# Patient Record
Sex: Male | Born: 1950 | Race: White | Hispanic: No | Marital: Single | Smoking: Current every day smoker
Health system: Southern US, Community
[De-identification: ages and names within clinical notes are randomized; demographics above are authoritative.]

## PROBLEM LIST (undated history)

## (undated) DIAGNOSIS — M549 Dorsalgia, unspecified: Secondary | ICD-10-CM

## (undated) DIAGNOSIS — F419 Anxiety disorder, unspecified: Secondary | ICD-10-CM

## (undated) DIAGNOSIS — G8929 Other chronic pain: Secondary | ICD-10-CM

## (undated) DIAGNOSIS — M5136 Other intervertebral disc degeneration, lumbar region: Secondary | ICD-10-CM

## (undated) DIAGNOSIS — L84 Corns and callosities: Secondary | ICD-10-CM

## (undated) DIAGNOSIS — F32A Depression, unspecified: Secondary | ICD-10-CM

## (undated) DIAGNOSIS — J984 Other disorders of lung: Secondary | ICD-10-CM

## (undated) DIAGNOSIS — M51369 Other intervertebral disc degeneration, lumbar region without mention of lumbar back pain or lower extremity pain: Secondary | ICD-10-CM

## (undated) DIAGNOSIS — K5900 Constipation, unspecified: Secondary | ICD-10-CM

## (undated) DIAGNOSIS — I10 Essential (primary) hypertension: Secondary | ICD-10-CM

## (undated) HISTORY — DX: Anxiety disorder, unspecified: F41.9

## (undated) HISTORY — DX: Essential (primary) hypertension: I10

## (undated) HISTORY — DX: Depression, unspecified: F32.A

## (undated) HISTORY — DX: Constipation, unspecified: K59.00

## (undated) HISTORY — DX: Corns and callosities: L84

## (undated) HISTORY — PX: FOOT SURGERY: SHX648

## (undated) HISTORY — DX: Other disorders of lung: J98.4

---

## 2003-12-29 ENCOUNTER — Emergency Department (HOSPITAL_COMMUNITY): Admission: EM | Admit: 2003-12-29 | Discharge: 2003-12-29 | Payer: Self-pay | Admitting: Emergency Medicine

## 2012-08-25 ENCOUNTER — Emergency Department (HOSPITAL_COMMUNITY)
Admission: EM | Admit: 2012-08-25 | Discharge: 2012-08-25 | Disposition: A | Discharge status: Home / Self Care | Payer: BC Managed Care – PPO | Attending: Emergency Medicine | Admitting: Emergency Medicine

## 2012-08-25 DIAGNOSIS — M5380 Other specified dorsopathies, site unspecified: Secondary | ICD-10-CM | POA: Insufficient documentation

## 2012-08-25 DIAGNOSIS — Z8739 Personal history of other diseases of the musculoskeletal system and connective tissue: Secondary | ICD-10-CM | POA: Insufficient documentation

## 2012-08-25 DIAGNOSIS — F17200 Nicotine dependence, unspecified, uncomplicated: Secondary | ICD-10-CM | POA: Insufficient documentation

## 2012-08-25 DIAGNOSIS — M545 Low back pain, unspecified: Secondary | ICD-10-CM | POA: Insufficient documentation

## 2012-08-25 DIAGNOSIS — Z7982 Long term (current) use of aspirin: Secondary | ICD-10-CM | POA: Insufficient documentation

## 2012-08-25 DIAGNOSIS — M62830 Muscle spasm of back: Secondary | ICD-10-CM

## 2012-08-25 MED ORDER — HYDROMORPHONE HCL PF 1 MG/ML IJ SOLN
1.0000 mg | Freq: Once | INTRAMUSCULAR | Status: DC
  Filled 2012-08-25: qty 1

## 2012-08-25 MED ORDER — HYDROMORPHONE HCL PF 1 MG/ML IJ SOLN
1.0000 mg | Freq: Once | INTRAMUSCULAR | Status: AC
  Administered 2012-08-25: 1 mg via INTRAMUSCULAR

## 2012-08-25 MED ORDER — DIAZEPAM 5 MG PO TABS
10.0000 mg | ORAL_TABLET | Freq: Once | ORAL | Status: AC
  Administered 2012-08-25: 10 mg via ORAL
  Filled 2012-08-25: qty 2

## 2012-08-25 NOTE — ED Provider Notes (Signed)
History  This chart was scribed for non-physician practitioner working with Gilda Crease, by Marlin Canary, ED Scribe and Bennett Scrape, ED Scribe. This patient was seen in room WTR6/WTR6 and the patient's care was started at 2022.  CSN: [id number]  Arrival date & time 08/25/12  4098   First MD Initiated Contact with Patient 08/25/12 2022      Chief Complaint  Patient presents with  . Back Pain     Patient is a 62 y.o. male presenting with back pain. The history is provided by the patient. No language interpreter was used.  Back Pain  The current episode started 1 to 2 hours ago. The problem occurs constantly. The problem has not changed since onset.The pain is present in the lumbar spine. The pain is severe. The symptoms are aggravated by bending. Pertinent negatives include no chest pain, no fever, no headaches, no abdominal pain and no dysuria. He has tried muscle relaxants for the symptoms.    Jerry Spears is a 62 y.o. male who presents to the Emergency Department complaining of lower back pain that began while bending over after washing his car onset 1-2 hours ago. Symptoms have been constant but waxing and waning since onset and he rates them as a severe. The patient reports the symptoms are located in the lumbar area and describes the pain as a cramping pain that radiates down his leg. Per patient, he denies urinary symptoms, urinary or bowel incontinence and saddle anesthesia as associated symptoms. The patient's symptoms are improved with sitting and exacerbated by movement. He reports a history of similar symptoms and states that he takes 3, 10 mg Percocet pills and 3, 10 mg Flexeril pills daily prescribed by his PCP for disk compression. Pt reports he took 6, 10 mg Percocet throughout the day and 2, 10 mg Flexeril around 1700 with no improvement in symptoms. He states he has had these symptoms at this severity 8 years ago. He denies following up with an orthopedist  for these symptoms before. He denies any other chronic medical conditions.  PCP Dr. Tiburcio Pea.    No past medical history on file.  No past surgical history on file.  No family history on file.  History  Substance Use Topics  . Smoking status: Not on file  . Smokeless tobacco: Not on file  . Alcohol Use: Not on file      Review of Systems  Constitutional: Negative for fever, diaphoresis, appetite change, fatigue and unexpected weight change.  HENT: Negative for mouth sores and neck stiffness.   Eyes: Negative for visual disturbance.  Respiratory: Negative for cough, chest tightness, shortness of breath and wheezing.   Cardiovascular: Negative for chest pain.  Gastrointestinal: Negative for nausea, vomiting, abdominal pain, diarrhea and constipation.  Genitourinary: Negative for dysuria, urgency, frequency and hematuria.  Musculoskeletal: Positive for back pain.  Skin: Negative for rash.  Neurological: Negative for syncope, light-headedness and headaches.  Hematological: Does not bruise/bleed easily.  Psychiatric/Behavioral: Negative for sleep disturbance. The patient is not nervous/anxious.   All other systems reviewed and are negative.    Allergies  Review of patient's allergies indicates no known allergies.  Home Medications   Current Outpatient Rx  Name  Route  Sig  Dispense  Refill  . ASPIRIN 325 MG PO TABS   Oral   Take 325 mg by mouth every 6 (six) hours as needed. For pain.         . CYCLOBENZAPRINE HCL 10 MG PO TABS  Oral   Take 10 mg by mouth 3 (three) times daily as needed. For muscle spasm.         . OXYCODONE-ACETAMINOPHEN 10-325 MG PO TABS   Oral   Take 1 tablet by mouth every 4 (four) hours as needed. For pain.           Triage Vitals: BP 173/90  Pulse 73  Temp 98.2 F (36.8 C) (Oral)  Resp 16  SpO2 99%  Physical Exam  Nursing note and vitals reviewed. Constitutional: He is oriented to person, place, and time. He appears  well-developed and well-nourished. No distress.  HENT:  Head: Normocephalic and atraumatic.  Mouth/Throat: Oropharynx is clear and moist. No oropharyngeal exudate.  Eyes: Conjunctivae normal are normal. No scleral icterus.  Neck: Normal range of motion. Neck supple.  Cardiovascular: Normal rate, regular rhythm, normal heart sounds and intact distal pulses.  Exam reveals no gallop and no friction rub.   No murmur heard.      Regular rate and rhythm  Pulmonary/Chest: Effort normal and breath sounds normal. No respiratory distress. He has no wheezes.  Abdominal: Soft. Bowel sounds are normal. He exhibits no mass. There is no tenderness. There is no rebound and no guarding.  Musculoskeletal: Normal range of motion. He exhibits no edema and no tenderness.       Lumbar back: He exhibits tenderness and pain. He exhibits no bony tenderness, no swelling, no edema, no deformity, no laceration and no spasm.       Decreased ROM when bending secondary to pain Full ROM in all other joints no paraspinous tenderness or spinious process tenderness in C, T, or L spine  Patient with difficulty standing secondary to pain.  Patient with obvious grimace as he has muscle spasm with movement.  Neurological: He is alert and oriented to person, place, and time. He has normal strength and normal reflexes. No cranial nerve deficit or sensory deficit. He exhibits normal muscle tone. GCS eye subscore is 4. GCS verbal subscore is 5. GCS motor subscore is 6.  Reflex Scores:      Patellar reflexes are 2+ on the right side and 2+ on the left side.      Achilles reflexes are 2+ on the right side and 2+ on the left side.      Speech is clear and goal oriented Moves extremities without ataxia 5/5 strength, reflexes intact and sensation intact in lower extremities, Intact distal pulses  Skin: Skin is warm and dry. No rash noted. He is not diaphoretic.          Psychiatric: He has a normal mood and affect.    ED Course    Procedures (including critical care time)  DIAGNOSTIC STUDIES: Oxygen Saturation is 99% on room air, Normal by my interpretation.    COORDINATION OF CARE: 2045-Patient informed of clinical course, understands medical decision-making process, and agrees with plan. Will order dilaudid and valium. Will also provide a referral to orthopedist. Pt's wife states that she can drive the pt home.  Labs Reviewed - No data to display No results found.   1. Back muscle spasm   2. Low back pain       MDM  Jerry Spears presents with persistent muscle spasm of his l-spine.  Patient without trauma, fall or known injury.   I do not believe that imaging is warranted at this time. Patient with back pain.  No neurological deficits and normal neuro exam.  He given 1 mg of  Dilaudid IM and thallium 10 mg by mouth. Patient states he feels much better after medication.  Patient is ambulatory without assistance after medication.  Patient can walk but states is still mildly painful.  No loss of bowel or bladder control.  No concern for cauda equina.  No fever, night sweats, weight loss, h/o cancer, IVDU.  RICE protocol and pain medicine indicated and discussed with patient. Will have patient followup with orthopedics or primary care physician.  1. Medications: usual home medications including Percocet and Flexeril as needed for back pain and spasm 2. Treatment: rest, drink plenty of fluids, take medications as prescribed 3. Follow Up: Please followup with your primary doctor for discussion of your diagnoses and further evaluation after today's visit; if you do not have a primary care doctor use the resource guide provided to find one; with [facility] - Dr. Yevette Edwards    I personally performed the services described in this documentation, which was scribed in my presence. The recorded information has been reviewed and is accurate.   Dahlia Client Kaitlynd Phillips, PA-C 08/25/12 2200

## 2012-08-25 NOTE — ED Notes (Signed)
Pt c/o low back pain and spasm. Pt states he has had back pain since this afternoon. States he was bending over and his back cramped up. Pt took Percocet and Flexeril PTA. Pt states he took about 6 Percocet throughout the day and 2 Flexeril. Pt last had Flexeril at 1700. Pt ambulatory to exam room with steady gait. Pt BIB spouse.

## 2012-08-26 ENCOUNTER — Emergency Department (HOSPITAL_COMMUNITY): Payer: BC Managed Care – PPO

## 2012-08-26 ENCOUNTER — Emergency Department (HOSPITAL_COMMUNITY)
Admission: EM | Admit: 2012-08-26 | Discharge: 2012-08-26 | Disposition: A | Discharge status: Home / Self Care | Payer: BC Managed Care – PPO | Attending: Emergency Medicine | Admitting: Emergency Medicine

## 2012-08-26 ENCOUNTER — Encounter (HOSPITAL_COMMUNITY): Payer: Self-pay | Admitting: *Deleted

## 2012-08-26 DIAGNOSIS — M51379 Other intervertebral disc degeneration, lumbosacral region without mention of lumbar back pain or lower extremity pain: Secondary | ICD-10-CM | POA: Insufficient documentation

## 2012-08-26 DIAGNOSIS — M546 Pain in thoracic spine: Secondary | ICD-10-CM | POA: Insufficient documentation

## 2012-08-26 DIAGNOSIS — M5137 Other intervertebral disc degeneration, lumbosacral region: Secondary | ICD-10-CM | POA: Insufficient documentation

## 2012-08-26 DIAGNOSIS — J42 Unspecified chronic bronchitis: Secondary | ICD-10-CM

## 2012-08-26 DIAGNOSIS — Z7982 Long term (current) use of aspirin: Secondary | ICD-10-CM | POA: Insufficient documentation

## 2012-08-26 DIAGNOSIS — F17200 Nicotine dependence, unspecified, uncomplicated: Secondary | ICD-10-CM | POA: Insufficient documentation

## 2012-08-26 DIAGNOSIS — M5126 Other intervertebral disc displacement, lumbar region: Secondary | ICD-10-CM

## 2012-08-26 HISTORY — DX: Other intervertebral disc degeneration, lumbar region: M51.36

## 2012-08-26 HISTORY — DX: Other intervertebral disc degeneration, lumbar region without mention of lumbar back pain or lower extremity pain: M51.369

## 2012-08-26 MED ORDER — IBUPROFEN 800 MG PO TABS
800.0000 mg | ORAL_TABLET | Freq: Once | ORAL | Status: AC
  Administered 2012-08-26: 800 mg via ORAL
  Filled 2012-08-26: qty 1

## 2012-08-26 MED ORDER — HYDROMORPHONE HCL 2 MG PO TABS: 2.0000 mg | ORAL_TABLET | Freq: Four times a day (QID) | ORAL | Status: DC | PRN

## 2012-08-26 MED ORDER — HYDROMORPHONE HCL PF 1 MG/ML IJ SOLN
1.0000 mg | Freq: Once | INTRAMUSCULAR | Status: AC
  Administered 2012-08-26: 1 mg via INTRAMUSCULAR
  Filled 2012-08-26: qty 1

## 2012-08-26 MED ORDER — DIAZEPAM 5 MG PO TABS: 5.0000 mg | ORAL_TABLET | Freq: Two times a day (BID) | ORAL | Status: DC

## 2012-08-26 MED ORDER — PREDNISONE 20 MG PO TABS
40.0000 mg | ORAL_TABLET | Freq: Once | ORAL | Status: AC
  Administered 2012-08-26: 40 mg via ORAL
  Filled 2012-08-26: qty 2

## 2012-08-26 MED ORDER — ALBUTEROL SULFATE HFA 108 (90 BASE) MCG/ACT IN AERS
2.0000 | INHALATION_SPRAY | Freq: Once | RESPIRATORY_TRACT | Status: AC
  Administered 2012-08-26: 2 via RESPIRATORY_TRACT
  Filled 2012-08-26: qty 6.7

## 2012-08-26 MED ORDER — HYDROMORPHONE HCL PF 1 MG/ML IJ SOLN
1.0000 mg | INTRAMUSCULAR | Status: DC | PRN
  Administered 2012-08-26: 1 mg via INTRAVENOUS
  Filled 2012-08-26: qty 1

## 2012-08-26 NOTE — ED Notes (Signed)
An attempt was made to make a secondary report but the patient was asleep and unable to talk coherently.  The patient was awaken and asked what his pain level was and he responded "32."  Pt's family states he has been awake for 24 hours.

## 2012-08-26 NOTE — ED Provider Notes (Signed)
History     CSN: [id number]  Arrival date & time 08/26/12  0440   First MD Initiated Contact with Patient 08/26/12 [phone]      Chief Complaint  Patient presents with  . Back Pain    (Consider location/radiation/quality/duration/timing/severity/associated sxs/prior treatment) HPI Comments: Jerry Spears reports severe back pain.  He has had 2 flexeril and 2 percocet at home since leaving the ER at 0040 without symptomatic relief.  He reports being unable to sleep because anytime he moves or rolls, the pain becomes unbearable.  Patient is a 62 y.o. male presenting with back pain. The history is provided by the patient. No language interpreter was used.  Back Pain  This is a recurrent problem. The current episode started yesterday. The problem occurs constantly. The problem has not changed since onset.Associated with: pain began after washing the car.  He stood from rolling the hose and immediately experienced severe, spastic pain. The pain is present in the lumbar spine. The quality of the pain is described as cramping and burning. The pain does not radiate. The pain is at a severity of 10/10. The pain is severe. The symptoms are aggravated by certain positions and bending. Pertinent negatives include no chest pain, no fever, no numbness, no abdominal pain, no abdominal swelling, no bowel incontinence, no perianal numbness, no bladder incontinence, no dysuria, no pelvic pain, no leg pain, no paresthesias, no paresis and no weakness. He has tried analgesics, muscle relaxants and bed rest for the symptoms. The treatment provided no relief.    Past Medical History  Diagnosis Date  . DDD (degenerative disc disease), lumbar     No past surgical history on file.  No family history on file.  History  Substance Use Topics  . Smoking status: Current Every Day Smoker    Types: Cigarettes  . Smokeless tobacco: Not on file  . Alcohol Use: Not on file      Review of Systems  Constitutional:  Negative for fever.  Cardiovascular: Negative for chest pain.  Gastrointestinal: Negative for abdominal pain and bowel incontinence.  Genitourinary: Negative for bladder incontinence, dysuria and pelvic pain.  Musculoskeletal: Positive for back pain.  Neurological: Negative for weakness, numbness and paresthesias.  All other systems reviewed and are negative.    Allergies  Review of patient's allergies indicates no known allergies.  Home Medications   Current Outpatient Rx  Name  Route  Sig  Dispense  Refill  . ASPIRIN 325 MG PO TABS   Oral   Take 325 mg by mouth every 6 (six) hours as needed. For pain.         . CYCLOBENZAPRINE HCL 10 MG PO TABS   Oral   Take 10 mg by mouth 3 (three) times daily as needed. For muscle spasm.         . OXYCODONE-ACETAMINOPHEN 10-325 MG PO TABS   Oral   Take 1 tablet by mouth every 4 (four) hours as needed. For pain.           BP 147/84  Pulse 71  Temp 98.8 F (37.1 C) (Oral)  Resp 20  SpO2 95%  Physical Exam  Nursing note and vitals reviewed. Constitutional: He is oriented to person, place, and time. He appears well-developed and well-nourished. No distress.  HENT:  Head: Normocephalic and atraumatic.  Right Ear: External ear normal.  Left Ear: External ear normal.  Nose: Nose normal.  Mouth/Throat: Oropharynx is clear and moist. No oropharyngeal exudate.  Eyes: Conjunctivae normal  are normal. Pupils are equal, round, and reactive to light. Right eye exhibits no discharge. Left eye exhibits no discharge. No scleral icterus.  Neck: Normal range of motion. Neck supple. No JVD present. No tracheal deviation present.  Cardiovascular: Normal rate, regular rhythm, normal heart sounds and intact distal pulses.  Exam reveals no gallop and no friction rub.   No murmur heard. Pulmonary/Chest: Effort normal. No stridor. No respiratory distress. He has wheezes. He has no rales. He exhibits no tenderness.  Abdominal: Soft. Bowel sounds are  normal. He exhibits no distension and no mass. There is no tenderness. There is no rebound and no guarding.  Musculoskeletal: He exhibits tenderness. He exhibits no edema.       Cervical back: Normal.       Thoracic back: He exhibits tenderness and pain. He exhibits no bony tenderness and no spasm.       Lumbar back: He exhibits decreased range of motion, tenderness, pain and spasm. He exhibits no bony tenderness, no swelling, no edema, no deformity and no laceration.  Lymphadenopathy:    He has no cervical adenopathy.  Neurological: He is alert and oriented to person, place, and time.  Skin: Skin is warm and dry. No rash noted. He is not diaphoretic. No erythema. No pallor.  Psychiatric: He has a normal mood and affect. His behavior is normal.    ED Course  Procedures (including critical care time)  Labs Reviewed - No data to display No results found.   No diagnosis found.    MDM  Pt presents for evaluation of back pain.  He was seen here and discharged at 0040 for similar pain which has not resolved.  He appear somnolent, easily aroused, nontoxic, note stable VS.  He has no focal neurologic deficit but is suffering from spastic lower back pain.  Will administer ibuprofen, prednisone, and dilaudid.  Will not administer any other sedatives as he already is having difficulty staying awake.  He is comfortable as long as he does not move.  0725.  Pt signed over to Dr. Lynelle Doctor.  He continues to have significant pain whenever he moves.  Plan continue to titrate pain medication and anti-spasmodics as tolerated.  CXR and lumbar xrays are pending.      Tobin Chad, MD 08/26/12 [id number]

## 2012-08-26 NOTE — ED Provider Notes (Signed)
The patient was seen by Dr. Lorenso Courier earlier. He ordered medications for pain and ordered x-rays as well as an MRI of the lumbar spine  Physical Exam  BP 147/84  Pulse 71  Temp 98.8 F (37.1 C) (Oral)  Resp 20  SpO2 95%  Physical Exam Alert, able to sit up with some discomfort ED Course  Procedures Dg Orbits  08/26/2012  *RADIOLOGY REPORT*  Clinical Data: Pre MRI. History of previous metal working.  ORBITS - COMPLETE 4+ VIEW  Comparison: None.  Findings: There is no radiodense foreign body in or around the orbits.  Osseous structures are normal.  IMPRESSION: Normal exam.   Original Report Authenticated By: Francene Boyers, M.D.    Dg Chest 2 View  08/26/2012  *RADIOLOGY REPORT*  Clinical Data: Cough and congestion.  New onset severe back pain.  CHEST - 2 VIEW  Comparison: None.  Findings: Trachea is midline.  Heart size normal.  Mild biapical pleural parenchymal scarring.  Lungs are otherwise clear.  No pleural fluid.  IMPRESSION: No acute findings.   Original Report Authenticated By: Leanna Battles, M.D.    Dg Lumbar Spine Complete  08/26/2012  *RADIOLOGY REPORT*  Clinical Data: New onset back pain.  LUMBAR SPINE - COMPLETE 4+ VIEW  Comparison: None.  Findings: Partial sacralization of L5.  Alignment is anatomic. Vertebral body height is maintained.  No definite pars defects. Endplate degenerative changes are worst at L3-4 and L5-S1, with associated loss of disc space height at L5-S1, and facet hypertrophy.  A fair amount of stool is in the visualized colon.  IMPRESSION:  1.  Spondylosis, worst at L3-4 and L5-S1. 2.  Constipation.   Original Report Authenticated By: Leanna Battles, M.D.    Dg Abd 1 View  08/26/2012  *RADIOLOGY REPORT*  Clinical Data: Pre MRI.  History of ingesting nail.  ABDOMEN - 1 VIEW  Comparison: 08/26/2012 the lumbar spine plain film examination.  Findings: No evidence of radiopaque foreign object throughout the bowel.  Moderate stool right colon.  IMPRESSION: No radiopaque  foreign body.   Original Report Authenticated By: Lacy Duverney, M.D.    Mr Lumbar Spine Wo Contrast  08/26/2012  *RADIOLOGY REPORT*  Clinical Data: Low back pain and spasm.  Degenerative disc disease.  MRI LUMBAR SPINE WITHOUT CONTRAST  Technique:  Multiplanar and multiecho pulse sequences of the lumbar spine were obtained without intravenous contrast.  Comparison: Multiple exams, including 08/26/2012  Findings: Comparison with prior chest radiographs and abdominal and lumbar radiographs reveals that there are 13 paired ribs, four fully segmental non-rib bearing vertebra, and also a transitional lumbosacral vertebra.  Accordingly, I will assume that there are small ribs at the L1 level, and that the transitional vertebra is a transitional S1.  This is congruent with the numbering on the prior lumbar spine radiographs.  If procedural intervention is to be performed, careful correlation with this numbering strategy would be recommended.  Based on this numbering strategy, the conus medullaris is at the L2- 3 level.    No mass or specific secondary findings of tethering.  Disc desiccation noted at all levels between L3 and S1, with loss of disc height at L5-S1.  Inversion recovery weighted images demonstrate no significant abnormal vertebral or periligamentous edema.  The patient refused the axial T1-weighted images due to back pain, despite being premedicated.  Additional findings at individual levels are as follows:  L2-3:  No impingement.  Bilateral facet arthropathy.  L3-4:  Mild left subarticular lateral recess stenosis due to  disc bulge.  Mild facet arthropathy.  L4-5:  Borderline left subarticular lateral recess stenosis secondary to disc bulge. Right greater than left facet arthropathy.  L5-S1:  Moderate right and mild left subarticular lateral recess stenosis observed with mild left and borderline right foraminal stenosis and borderline central stenosis secondary to disc bulge, left foraminal disc  protrusion, intervertebral and facet spurring, and mild ligament flavum redundancy.  S1-2:  No impingement.  Broad left transverse process of S1 articulates with the remainder of the sacrum.  Small central hemangioma is seen in the S1 vertebral body.  IMPRESSION:  1.  Lumbar spondylosis and degenerative disc disease cause moderate impingement at L5-S1 and mild impingement L3-4 as noted above. 2.  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 This is particularly important if procedural intervention is to be employed.  The low positioning of the conus, at L2-3, is believed to be due to this numbering situation given that no specific findings of tethering are observed.   Original Report Authenticated By: Gaylyn Rong, M.D.     MDM Patient's MRI shows it is a herniated disc causing impingement. This is the etiology of his back pain. He has no acute neurologic dysfunction that would require emergent surgery.  Patient referred to neurosurgery for further outpatient followup.  I will add on oral Dilaudid tablets instead of his Percocet. He understands to not take both simultaneously. Otherwise discontinue the Flexeril and have him try Valium.      Celene Kras, MD 08/26/12 1054

## 2012-08-26 NOTE — ED Notes (Signed)
Pt c/o acute exacerbation of chronic back pain. Pt seen and treated yesterday, gained some relief at that time.

## 2012-08-26 NOTE — ED Provider Notes (Signed)
Medical screening examination/treatment/procedure(s) were performed by non-physician practitioner and as supervising physician I was immediately available for consultation/collaboration.   Gilda Crease, MD 08/26/12 0040

## 2013-06-23 ENCOUNTER — Ambulatory Visit: Payer: Self-pay | Admitting: Family Medicine

## 2013-06-23 VITALS — BP 132/80 | HR 78 | Temp 97.9°F | Resp 16 | Ht 75.0 in | Wt 230.0 lb

## 2013-06-23 DIAGNOSIS — Z0289 Encounter for other administrative examinations: Secondary | ICD-10-CM

## 2013-06-23 DIAGNOSIS — Z0000 Encounter for general adult medical examination without abnormal findings: Secondary | ICD-10-CM

## 2013-06-23 NOTE — Progress Notes (Signed)
UA: Sp Gr: 1.030 Protein: neg Blood: neg Sugar: neg

## 2013-06-23 NOTE — Progress Notes (Signed)
Patient ID: Jerry Spears., male   DOB: 23-Nov-1950, 62 y.o.   MRN: [id number]  @[email or web address] @  Patient ID: Jerry Spears. MRN: [id number], DOB: [birth year], 62 y.o. Date of Encounter: 06/23/2013, 9:55 AM This chart was scribed for Elvina Sidle, MD by Valera Castle, ED Scribe. This patient was seen in room 4 and the patient's care was started at 9:55 AM.  Primary Physician: Provider Not In System  Chief Complaint: DOT  HPI: 62 y.o. year old male with history below presents for a DOT exam. He reports h/o spurs on his lower spine, being seen by doctor, and being told he was going to have to live with the pain. He reports taking Percocet for the pain daily, with relief. He reports needing to make an appointment to get new glasses. He states being able to see enough for work, but that his eyes are slowly getting worse. He reports a h/o smoking. He denies any other complaints and medical history.   He reports living in [place] and having a granddaughter in middle school.    Past Medical History  Diagnosis Date  . DDD (degenerative disc disease), lumbar      Home Meds: Prior to Admission medications   Medication Sig Start Date End Date Taking? Authorizing Provider  oxyCODONE-acetaminophen (PERCOCET) 10-325 MG per tablet Take 1 tablet by mouth every 4 (four) hours as needed. For pain.   Yes Historical Provider, MD  aspirin 325 MG tablet Take 325 mg by mouth every 6 (six) hours as needed. For pain.    Historical Provider, MD  diazepam (VALIUM) 5 MG tablet Take 1 tablet (5 mg total) by mouth 2 (two) times daily. 08/26/12   Celene Kras, MD  HYDROmorphone (DILAUDID) 2 MG tablet Take 1 tablet (2 mg total) by mouth every 6 (six) hours as needed for pain. 08/26/12   Celene Kras, MD   Allergies: No Known Allergies  History   Social History  . Marital Status: Married    Spouse Name: N/A    Number of Children: N/A  . Years of Education: N/A   Occupational History  . Not on file.    Social History Main Topics  . Smoking status: Current Every Day Smoker    Types: Cigarettes  . Smokeless tobacco: Not on file  . Alcohol Use: Not on file  . Drug Use: No  . Sexual Activity: Yes    Birth Control/ Protection: None   Other Topics Concern  . Not on file   Social History Narrative  . No narrative on file     Review of Systems: Constitutional: negative for chills, fever, night sweats, weight changes, or fatigue  HEENT: negative for vision changes, hearing loss, congestion, rhinorrhea, ST, epistaxis, or sinus pressure Cardiovascular: negative for chest pain or palpitations Respiratory: negative for hemoptysis, wheezing, shortness of breath, or cough Abdominal: negative for abdominal pain, nausea, vomiting, diarrhea, or constipation Dermatological: negative for rash Musc/Skeletal: Positive for lower back pain Neurologic: negative for headache, dizziness, or syncope All other systems reviewed and are otherwise negative with the exception to those above and in the HPI.   Physical Exam: Blood pressure 132/80, pulse 78, temperature 97.9 F (36.6 C), temperature source Oral, resp. rate 16, height 6\' 3"  (1.905 m), weight 230 lb (104.327 kg), SpO2 98.00%., Body mass index is 28.75 kg/(m^2). General: Well developed, well nourished, in no acute distress. Head: Normocephalic, atraumatic, eyes without discharge, sclera non-icteric, nares are without discharge. Bilateral auditory canals clear,  TM's are without perforation, pearly grey and translucent with reflective cone of light bilaterally. Oral cavity moist, posterior pharynx without exudate, erythema, peritonsillar abscess, or post nasal drip.  Neck: Supple. No thyromegaly. Full ROM. No lymphadenopathy. Lungs: Clear bilaterally to auscultation without wheezes, rales, or rhonchi. Breathing is unlabored. Heart: RRR with S1 S2. No murmurs, rubs, or gallops appreciated. Abdomen: Soft, non-tender, non-distended with normoactive  bowel sounds. No hepatomegaly. No rebound/guarding. No obvious abdominal masses. Msk:  Strength and tone normal for age. Extremities/Skin: Warm and dry. No clubbing or cyanosis. No edema. No rashes or suspicious lesions. Neuro: Alert and oriented X 3. Moves all extremities spontaneously. Gait is normal. CNII-XII grossly in tact. Psych:  Responds to questions appropriately with a normal affect.   ASSESSMENT AND PLAN:  62 y.o. year old male with DOT PE normal   Signed, Elvina Sidle, MD 06/23/2013 9:55 AM     I personally performed the services described in this documentation, which was scribed in my presence. The recorded information has been reviewed and is accurate.

## 2014-08-03 ENCOUNTER — Encounter (HOSPITAL_COMMUNITY): Payer: Self-pay | Admitting: *Deleted

## 2014-08-03 ENCOUNTER — Emergency Department (HOSPITAL_COMMUNITY)
Admission: EM | Admit: 2014-08-03 | Discharge: 2014-08-03 | Disposition: A | Discharge status: Home / Self Care | Payer: BC Managed Care – PPO | Attending: Emergency Medicine | Admitting: Emergency Medicine

## 2014-08-03 DIAGNOSIS — G8929 Other chronic pain: Secondary | ICD-10-CM | POA: Insufficient documentation

## 2014-08-03 DIAGNOSIS — Z79899 Other long term (current) drug therapy: Secondary | ICD-10-CM | POA: Insufficient documentation

## 2014-08-03 DIAGNOSIS — Z7982 Long term (current) use of aspirin: Secondary | ICD-10-CM | POA: Insufficient documentation

## 2014-08-03 DIAGNOSIS — Z720 Tobacco use: Secondary | ICD-10-CM | POA: Insufficient documentation

## 2014-08-03 DIAGNOSIS — F1120 Opioid dependence, uncomplicated: Secondary | ICD-10-CM

## 2014-08-03 DIAGNOSIS — Z8739 Personal history of other diseases of the musculoskeletal system and connective tissue: Secondary | ICD-10-CM | POA: Insufficient documentation

## 2014-08-03 NOTE — Discharge Instructions (Signed)
Finding Treatment for Alcohol and Drug Addiction °It can be hard to find the right place to get professional treatment. Here are some important things to consider: °· There are different types of treatment to choose from. °· Some programs are live-in (residential) while others are not (outpatient). Sometimes a combination is offered. °· No single type of program is right for everyone. °· Most treatment programs involve a combination of education, counseling, and a 12-step, spiritually-based approach. °· There are non-spiritually based programs (not 12-step). °· Some treatment programs are government sponsored. They are geared for patients without private insurance. °· Treatment programs can vary in many respects such as: °¨ Cost and types of insurance accepted. °¨ Types of on-site medical services offered. °¨ Length of stay, setting, and size. °¨ Overall philosophy of treatment.  °A person may need specialized treatment or have needs not addressed by all programs. For example, adolescents need treatment appropriate for their age. Other people have secondary disorders that must be managed as well. Secondary conditions can include mental illness, such as depression or diabetes. Often, a period of detoxification from alcohol or drugs is needed. This requires medical supervision and not all programs offer this. °THINGS TO CONSIDER WHEN SELECTING A TREATMENT PROGRAM  °· Is the program certified by the appropriate government agency? Even private programs must be certified and employ certified professionals. °· Does the program accept your insurance? If not, can a payment plan be set up? °· Is the facility clean, organized, and well run? Do they allow you to speak with graduates who can share their treatment experience with you? Can you tour the facility? Can you meet with staff? °· Does the program meet the full range of individual needs? °· Does the treatment program address sexual orientation and physical disabilities?  Do they provide age, gender, and culturally appropriate treatment services? °· Is treatment available in languages other than English? °· Is long-term aftercare support or guidance encouraged and provided? °· Is assessment of an individual's treatment plan ongoing to ensure it meets changing needs? °· Does the program use strategies to encourage reluctant patients to remain in treatment long enough to increase the likelihood of success? °· Does the program offer counseling (individual or group) and other behavioral therapies? °· Does the program offer medicine as part of the treatment regimen, if needed? °· Is there ongoing monitoring of possible relapse? Is there a defined relapse prevention program? Are services or referrals offered to family members to ensure they understand addiction and the recovery process? This would help them support the recovering individual. °· Are 12-step meetings held at the center or is transport available for patients to attend outside meetings? °In countries outside of the [place], see local directories for contact information for services in your area. °Document Released: 06/22/2005 Document Revised: 10/16/2011 Document Reviewed: 01/02/2008 °ExitCare® Patient Information ©2015 [employer]. This information is not intended to replace advice given to you by your health care provider. Make sure you discuss any questions you have with your health care provider. ° °Emergency Department Resource Guide °1) Find a Doctor and Pay Out of Pocket °Although you won't have to find out who is covered by your insurance plan, it is a good idea to ask around and get recommendations. You will then need to call the office and see if the doctor you have chosen will accept you as a new patient and what types of options they offer for patients who are self-pay. Some doctors offer discounts or will   set up payment plans for their patients who do not have insurance, but you will need to ask so you  aren't surprised when you get to your appointment. ° °2) Contact Your Local Health Department °Not all health departments have doctors that can see patients for sick visits, but many do, so it is worth a call to see if yours does. If you don't know where your local health department is, you can check in your phone book. The CDC also has a tool to help you locate your state's health department, and many state websites also have listings of all of their local health departments. ° °3) Find a Walk-in Clinic °If your illness is not likely to be very severe or complicated, you may want to try a walk in clinic. These are popping up all over the country in pharmacies, drugstores, and shopping centers. They're usually staffed by nurse practitioners or physician assistants that have been trained to treat common illnesses and complaints. They're usually fairly quick and inexpensive. However, if you have serious medical issues or chronic medical problems, these are probably not your best option. ° °No Primary Care Doctor: °- Call [facility] at  832-8000 - they can help you locate a primary care doctor that  accepts your insurance, provides certain services, etc. °- Physician Referral Service- 1-800-533-3463 ° °Chronic Pain Problems: °Organization         Address  Phone   Notes  °[facility]  (336) 297-2271 Patients need to be referred by their primary care doctor.  ° °Medication Assistance: °Organization         Address  Phone   Notes  °[place] [facility] [address] °[place], [place] [address] (336) 641-8030 --Must be a resident of [place] °-- Must have NO insurance coverage whatsoever (no Medicaid/ Medicare, etc.) °-- The pt. MUST have a primary care doctor that directs their care regularly and follows them in the community °  °[facility]  (866) 331-1348   °[employer]  (888) 892-1162   ° °Agencies that provide inexpensive medical care: °Organization          Address  Phone   Notes  °[facility]  (336) 832-8035   °[facility]    (336) 832-7272   °[facility] [address] °[place], [place] [address] (336) 832-4777   °[facility] [address], °[place] (336) 271-4999   °[facility]    (336) 373-0678   °[facility]    (336) 272-1050   °[facility] ° [address], [place] Phone:  (336) 832-4444, Fax:  (336) 832-4440 Hours of Operation:  9 am - 6 pm, M-F.  Also accepts Medicaid/Medicare and self-pay.  °[facility] ° [address], [place] Phone: (336) 832-3150, Fax: (336) 832-3151. Hours of Operation:  8:30 am - 5:30 pm, M-F.  Also accepts Medicaid and self-pay.  °[facility] [place] [address], [place] Phone: (336) 878-6027   °[facility] [address], [place], [place] (336)723-1848, Ext. 123 Mondays & Thursdays: 7-9 AM.  First 15 patients are seen on a first come, first serve basis. °  ° °Medicaid-accepting [place] Providers: ° °Organization         Address  Phone   Notes  °[facility] [address], [place] (336) 641-2100   Also accepts self-pay patients.  °[facility] [address], [place] ° (336) 856-9996   °[facility] [address], [place] (336) 288-8857   °[facility] [address] [place] [address], [place] (336) 299-7000   °Veita Bland [address], [place]  ° (336) 373-1557 Only accepts [place] Access Medicaid patients after they have their name applied to their card.  ° °Self-Pay (no insurance) in [place]: ° °Organization         Address  Phone   Notes  °Sickle Cell Patients, [facility] [address], [place] (336) 832-1970   °[facility] [address], [place] (336) 832-4400    °[facility] ° [address], [place] (336) 992-4800   °[facility]/Dr. Osei-Bonsu ° [address] [place] [address], [place] or [address], [place] (336) 841-8500 Phone number for both [place] and [place] locations is the same.  °[facility] [address], [place] (336) 299-0000   °[facility] [address] [place] [address], [place] or [address] (336) 852-7530 °(336) 878-2260   °[facility] [address], [place] (336) 350-1642, phone; (336) 294-5005, fax Sees patients 1st and 3rd Saturday of every month.  Must not qualify for public or private insurance (i.e. Medicaid, Medicare, [place] Health Choice, Veterans' Benefits) • Household income should be no more than 200% of the poverty level •The clinic cannot treat you if you are pregnant or think you are pregnant • Sexually transmitted diseases are not treated at the clinic.  ° ° °Dental Care: °Organization         Address  Phone  Notes  °[facility] [address], [place] (336) 641-6152 Accepts children up to age 21 who are enrolled in Medicaid or [place] Health Choice; pregnant women with a Medicaid card; and children who have applied for Medicaid or [place] Health Choice, but were declined, whose parents can pay a reduced fee at time of service.  °[facility] [place]  [address], [place] (336) 641-7733 Accepts children up to age 21 who are enrolled in Medicaid or [place] Health Choice; pregnant women with a Medicaid card; and children who have applied for Medicaid or [place] Health Choice, but were declined, whose parents can pay a reduced fee at time of service.  °[facility] ° [address], [place] (336) 641-4533 Patients are seen by appointment only. Walk-ins are not accepted. [facility] will see patients 18  years of age and older. °Monday - Tuesday (8am-5pm) °Most Wednesdays (8:30-5pm) °$30 per visit, cash only  °[facility] ° [address], [place] (336) 641-4533 Patients are seen by appointment only. Walk-ins are not accepted. [facility] will see patients 18 years of age and older. °One Wednesday Evening (Monthly: Volunteer Based).  $30 per visit, cash only  °[facility]  (919) 537-3737 for adults; Children under age 4, call Graduate Pediatric Dentistry at (919) [phone]. Children aged 4-14, please call (919) [phone] to request a pediatric application. ° Dental services are provided in all areas of dental care including fillings, crowns and bridges, complete and partial dentures, implants, gum treatment, root canals, and extractions. Preventive care is also provided. Treatment   is provided to both adults and children. °Patients are selected via a lottery and there is often a waiting list. °  °[facility] [address], °[place] ° (336) 763-8833 [email or web address] °  °[facility] [address], [place], [place] (336)723-1848, Ext. 123 Second and Fourth Thursday of each month, opens at 6:30 AM; Clinic ends at 9 AM.  Patients are seen on a first-come first-served basis, and a limited number are seen during each clinic.  ° °[facility] ° [address], [place], [place] (336) 723-7904   Eligibility Requirements °You must have lived in [place], [place], or [place] counties for at least the last three months. °  You cannot be eligible for state or federal sponsored healthcare insurance, including Veterans Administration, Medicaid, or Medicare. °  You generally cannot be eligible for healthcare insurance through your employer.  °  How to apply: °Eligibility screenings are held every Tuesday and Wednesday afternoon from 1:00 pm until 4:00 pm. You do not need an appointment for the interview!  °[facility]  [address], [place], [place] 336-631-2330   °[facility]  336-342-8273   °[facility]  336-703-3100   °[facility]  336-570-6415   ° °Behavioral Health Resources in the Community: °Intensive Outpatient Programs °Organization         Address  Phone  Notes  °[facility] [address], [place], [place] 336-878-6098   °[facility] [address], [place], [place] 336-832-9800   °[facility] [address], [place], [place] ° 336-882-2125   °[facility] [address],  °[place], [place] 1-800-853-5163 or 336-641-4981   °Substance Abuse Resources °Organization         Address  Phone  Notes  °[facility]  336-882-2125   °[facility]  336-784-9470   °[facility]  336-285-9073   °[facility]  336-845-3988   °Residential & Outpatient Substance Abuse Program  1-800-659-3381   °Psychological Services °Organization         Address  Phone  Notes  °[facility]  336- 832-9600   °[facility]  336- 378-7881   °[facility] [address], [place] 1-800-853-5163 or 336-641-4981   ° °Mobile Crisis Teams °Organization         Address  Phone  Notes  °[facility], Mobile Crisis Care Unit  1-877-626-1772   °Assertive °[facility] ° [address] [place], [place] 336-834-9664   °Sharon DeEsch [address] °[place] 336-554-5454   ° °Self-Help/Support Groups °Organization         Address  Phone             Notes  °[facility] - variety of support groups  336- 373-1402 Call for more information  °Narcotics Anonymous (NA), [facility] [address], °[place]  2 meetings at this location  ° °Residential Treatment Programs °Organization         Address  Phone  Notes  °[facility] [address],    °    1-866-801-8205   °[facility] ° [address], [place],  704-293-8524   °[facility] [address], [place] 336-845-3988 Admissions: 8am-3pm M-F  °[facility] [address]    °[place],   [place] 336-841-1104   °[facility] [address], [place], [place] 336-379-7146   °[facility] [address]  °[place], [place] 336-285-9073   °[facility] - Intensive Outpatient [address], [place], [place] 336-852-3033   °[facility] [address]  °[place], [place] 1-877-615-2722 or 336-784-9470   °[facility] [address] [place], [place] 336-227-7417 Accepts Medicaid  °[facility] [address]  °[place] 1-800-659-3381 Substance Abuse/Addiction Treatment  ° °[place] Behavioral Health Resources °Organization         Address  Phone  Notes  °[facility]  (888) 581-9988   °Julie Brannon, PhD [address] [place], [place]   (336) 349-5553 or (336) 951-0000   °[facility]   [address] °[place], [place] (336) 349-4454   °[facility] [address], [place], [place] (336) 342-8316 Insurance/Medicaid/sponsorship through [facility]  °[facility] [address]                                    [place], [place] (336) 342-8316 Therapy/tele-psych/case  °[facility] [address]  ° [place], [place] (336) 349-2233    °Dr. Arfeen  (336) 349-4544   °[facility] [place]  [employer] [place] [facility] 1) [address],  °2) [address], [place] °3)  [address], [place] (336) 349-3220 °(336) 342-7768 ° °(336) 342-8140   °[place] [facility] (336) 342-1394 or (336) 342-3537 (After Hours)    ° ° °

## 2014-08-03 NOTE — ED Provider Notes (Signed)
CSN: [id number]     Arrival date & time 08/03/14  1438 History   First MD Initiated Contact with Patient 08/03/14 1604     Chief Complaint  Patient presents with  . opioid detox      (Consider location/radiation/quality/duration/timing/severity/associated sxs/prior Treatment) Patient is a 63 y.o. male presenting with drug problem. The history is provided by the patient.  Drug Problem This is a chronic problem. The current episode started more than 1 week ago. The problem occurs constantly. The problem has not changed since onset.Pertinent negatives include no abdominal pain and no shortness of breath. Nothing aggravates the symptoms. Nothing relieves the symptoms.    Past Medical History  Diagnosis Date  . DDD (degenerative disc disease), lumbar    History reviewed. No pertinent past surgical history. History reviewed. No pertinent family history. History  Substance Use Topics  . Smoking status: Current Every Day Smoker    Types: Cigarettes  . Smokeless tobacco: Not on file  . Alcohol Use: Not on file    Review of Systems  Constitutional: Negative for fever and chills.  Respiratory: Negative for cough and shortness of breath.   Gastrointestinal: Negative for vomiting and abdominal pain.  All other systems reviewed and are negative.     Allergies  Review of patient's allergies indicates no known allergies.  Home Medications   Prior to Admission medications   Medication Sig Start Date End Date Taking? Authorizing Provider  ALPRAZolam Prudy Feeler(XANAX) 0.5 MG tablet Take 0.5 mg by mouth 2 (two) times daily as needed for anxiety (anxiety).   Yes Historical Provider, MD  amLODipine (NORVASC) 5 MG tablet Take 5 mg by mouth daily.   Yes Historical Provider, MD  diazepam (VALIUM) 5 MG tablet Take 1 tablet (5 mg total) by mouth 2 (two) times daily. 08/26/12  Yes Linwood DibblesJon Knapp, MD  ibuprofen (ADVIL,MOTRIN) 200 MG tablet Take 400 mg by mouth every 6 (six) hours as needed for headache  (headache).   Yes Historical Provider, MD  oxyCODONE-acetaminophen (PERCOCET) 10-325 MG per tablet Take 1 tablet by mouth every 4 (four) hours as needed. For pain.   Yes Historical Provider, MD  aspirin 325 MG tablet Take 325 mg by mouth every 6 (six) hours as needed. For pain.    Historical Provider, MD  HYDROmorphone (DILAUDID) 2 MG tablet Take 1 tablet (2 mg total) by mouth every 6 (six) hours as needed for pain. 08/26/12   Linwood DibblesJon Knapp, MD   BP 161/108 mmHg  Pulse 64  Temp(Src) 98.5 F (36.9 C) (Oral)  Resp 18  Ht 6\' 3"  (1.905 m)  Wt 215 lb (97.523 kg)  BMI 26.87 kg/m2  SpO2 100% Physical Exam  Constitutional: He is oriented to person, place, and time. He appears well-developed and well-nourished. No distress.  HENT:  Head: Normocephalic and atraumatic.  Mouth/Throat: Oropharynx is clear and moist. No oropharyngeal exudate.  Eyes: EOM are normal. Pupils are equal, round, and reactive to light.  Neck: Normal range of motion. Neck supple.  Cardiovascular: Normal rate and regular rhythm.  Exam reveals no friction rub.   No murmur heard. Pulmonary/Chest: Effort normal and breath sounds normal. No respiratory distress. He has no wheezes. He has no rales.  Abdominal: Soft. He exhibits no distension. There is no tenderness. There is no rebound.  Musculoskeletal: Normal range of motion. He exhibits no edema.  Neurological: He is alert and oriented to person, place, and time.  Skin: No rash noted. He is not diaphoretic.  Nursing note and  vitals reviewed.   ED Course  Procedures (including critical care time) Labs Review Labs Reviewed - No data to display  Imaging Review No results found.   EKG Interpretation None      MDM   Final diagnoses:  Uncomplicated opioid dependence    21M here wanting detox from opioids. Using about 6-8 percocet per day, has used them for 7 years. No SI/HI. Began using them for chronic low back pain. AFVSS here, relaxed, cooperative. Explained he is a  good candidate for outpatient detox. Patient given resources, stable for discharge. Of note, he is also on Xanax, but hasn't used them in about 10 days, so doesn't need inpatient detox for benzo withdrawal.     Elwin MochaBlair Osias Resnick, MD 08/03/14 1649

## 2014-08-03 NOTE — ED Notes (Signed)
Pt requesting opioid detox. Pt denies ETOH use. Reports percocet use x7 years. Took 1 pill last night. Denies pain.

## 2014-10-14 ENCOUNTER — Emergency Department: Payer: Self-pay | Admitting: Emergency Medicine

## 2015-01-07 ENCOUNTER — Encounter: Payer: Self-pay | Admitting: Emergency Medicine

## 2015-01-07 ENCOUNTER — Emergency Department
Admission: EM | Admit: 2015-01-07 | Discharge: 2015-01-07 | Disposition: A | Discharge status: Home / Self Care | Payer: Self-pay | Attending: Emergency Medicine | Admitting: Emergency Medicine

## 2015-01-07 DIAGNOSIS — G8929 Other chronic pain: Secondary | ICD-10-CM | POA: Insufficient documentation

## 2015-01-07 DIAGNOSIS — Z79899 Other long term (current) drug therapy: Secondary | ICD-10-CM | POA: Insufficient documentation

## 2015-01-07 DIAGNOSIS — F1310 Sedative, hypnotic or anxiolytic abuse, uncomplicated: Secondary | ICD-10-CM | POA: Insufficient documentation

## 2015-01-07 DIAGNOSIS — F1110 Opioid abuse, uncomplicated: Secondary | ICD-10-CM | POA: Insufficient documentation

## 2015-01-07 DIAGNOSIS — M549 Dorsalgia, unspecified: Secondary | ICD-10-CM | POA: Insufficient documentation

## 2015-01-07 DIAGNOSIS — Z720 Tobacco use: Secondary | ICD-10-CM | POA: Insufficient documentation

## 2015-01-07 HISTORY — DX: Other chronic pain: G89.29

## 2015-01-07 HISTORY — DX: Dorsalgia, unspecified: M54.9

## 2015-01-07 LAB — COMPREHENSIVE METABOLIC PANEL
ALBUMIN: 3.7 g/dL (ref 3.5–5.0)
ALT: 12 U/L — AB (ref 17–63)
ANION GAP: 5 (ref 5–15)
AST: 24 U/L (ref 15–41)
Alkaline Phosphatase: 59 U/L (ref 38–126)
BILIRUBIN TOTAL: 0.4 mg/dL (ref 0.3–1.2)
BUN: 19 mg/dL (ref 6–20)
CO2: 27 mmol/L (ref 22–32)
CREATININE: 1.04 mg/dL (ref 0.61–1.24)
Calcium: 8.5 mg/dL — ABNORMAL LOW (ref 8.9–10.3)
Chloride: 109 mmol/L (ref 101–111)
GFR calc non Af Amer: >60 mL/min (ref >60)
GLUCOSE: 128 mg/dL — AB (ref 65–99)
POTASSIUM: 3.8 mmol/L (ref 3.5–5.1)
Sodium: 141 mmol/L (ref 135–145)
Total Protein: 6.9 g/dL (ref 6.5–8.1)

## 2015-01-07 LAB — ETHANOL: Alcohol, Ethyl (B): <5 mg/dL (ref <5)

## 2015-01-07 LAB — URINE DRUG SCREEN, QUALITATIVE (ARMC ONLY)
Amphetamines, Ur Screen: NOT DETECTED
Barbiturates, Ur Screen: NOT DETECTED
Benzodiazepine, Ur Scrn: POSITIVE — AB
CANNABINOID 50 NG, UR ~~LOC~~: NOT DETECTED
Cocaine Metabolite,Ur ~~LOC~~: NOT DETECTED
MDMA (Ecstasy)Ur Screen: NOT DETECTED
Methadone Scn, Ur: NOT DETECTED
OPIATE, UR SCREEN: POSITIVE — AB
Phencyclidine (PCP) Ur S: NOT DETECTED
Tricyclic, Ur Screen: NOT DETECTED

## 2015-01-07 LAB — CBC
HCT: 41.9 % (ref 40.0–52.0)
Hemoglobin: 14 g/dL (ref 13.0–18.0)
MCH: 33.4 pg (ref 26.0–34.0)
MCHC: 33.4 g/dL (ref 32.0–36.0)
MCV: 100.1 fL — AB (ref 80.0–100.0)
Platelets: 199 10*3/uL (ref 150–440)
RBC: 4.19 MIL/uL — ABNORMAL LOW (ref 4.40–5.90)
RDW: 12.7 % (ref 11.5–14.5)
WBC: 5.6 10*3/uL (ref 3.8–10.6)

## 2015-01-07 LAB — ACETAMINOPHEN LEVEL: Acetaminophen (Tylenol), Serum: <10 ug/mL — ABNORMAL LOW (ref 10–30)

## 2015-01-07 LAB — SALICYLATE LEVEL

## 2015-01-07 NOTE — Discharge Instructions (Signed)
Opioid Use Disorder °Opioid use disorder is a mental disorder. It is the continued nonmedical use of opioids in spite of risks to health and well-being. Misused opioids include the street drug heroin. They also include pain medicines such as morphine, hydrocodone, oxycodone, and fentanyl. Opioids are very addictive. People who misuse opioids get an exaggerated feeling of well-being. Opioid use disorder often disrupts activities at home, work, or school. It may cause mental or physical problems.  °A family history of opioid use disorder puts you at higher risk of it. People with opioid use disorder often misuse other drugs or have mental illness such as depression, posttraumatic stress disorder, or antisocial personality disorder. They also are at risk of suicide and death from overdose. °SIGNS AND SYMPTOMS  °Signs and symptoms of opioid use disorder include: °· Use of opioids in larger amounts or over a longer period than intended. °· Unsuccessful attempts to cut down or control opioid use. °· A lot of time spent obtaining, using, or recovering from the effects of opioids. °· A strong desire or urge to use opioids (craving). °· Continued use of opioids in spite of major problems at work, school, or home because of use. °· Continued use of opioids in spite of relationship problems because of use. °· Giving up or cutting down on important life activities because of opioid use. °· Use of opioids over and over in situations when it is physically hazardous, such as driving a car. °· Continued use of opioids in spite of a physical problem that is likely related to use. Physical problems can include: °¨ Severe constipation. °¨ Poor nutrition. °¨ Infertility. °¨ Tuberculosis. °¨ Aspiration pneumonia. °¨ Infections such as human immunodeficiency virus (HIV) and hepatitis (from injecting opioids). °· Continued use of opioids in spite of a mental problem that is likely related to use. Mental problems can  include: °¨ Depression. °¨ Anxiety. °¨ Hallucinations. °¨ Sleep problems. °¨ Loss of sexual function. °· Need to use more and more opioids to get the same effect, or lessened effect over time with use of the same amount (tolerance). °· Having withdrawal symptoms when opioid use is stopped, or using opioids to reduce or avoid withdrawal symptoms. Withdrawal symptoms include: °¨ Depressed, anxious, or irritable mood. °¨ Nausea, vomiting, diarrhea, or intestinal cramping. °¨ Muscle aches or spasms. °¨ Excessive tearing or runny nose. °¨ Dilated pupils, sweating, or hairs standing on end. °¨ Yawning. °¨ Fever, raised blood pressure, or fast pulse. °¨ Restlessness or trouble sleeping. This does not apply to people taking opioids for medical reasons only. °DIAGNOSIS °Opioid use disorder is diagnosed by your health care provider. You may be asked questions about your opioid use and and how it affects your life. A physical exam may be done. A drug screen may be ordered. You may be referred to a mental health professional. The diagnosis of opioid use disorder requires at least two symptoms within 12 months. The type of opioid use disorder you have depends on the number of signs and symptoms you have. The type may be: °· Mild. Two or three signs and symptoms.    °· Moderate. Four or five signs and symptoms.   °· Severe. Six or more signs and symptoms. °TREATMENT  °Treatment is usually provided by mental health professionals with training in substance use disorders. The following options are available: °· Detoxification. This is the first step in treatment for withdrawal. It is medically supervised withdrawal with the use of medicines. These medicines lessen withdrawal symptoms. They also raise the chance   of becoming opioid free.  Counseling, also known as talk therapy. Talk therapy addresses the reasons you use opioids. It also addresses ways to keep you from using again (relapse). The goals of talk therapy are to avoid  relapse by:  Identifying and avoiding triggers for use.  Finding healthy ways to cope with stress.  Learning how to handle cravings.  Support groups. Support groups provide emotional support, advice, and guidance.  A medicine that blocks opioid receptors in your brain. This medicine can reduce opioid cravings that lead to relapse. This medicine also blocks the desired opioid effect when relapse occurs.  Opioids that are taken by mouth in place of the misused opioid (opioid maintenance treatment). These medicines satisfy cravings but are safer than commonly misused opioids. This often is the best option for people who continue to relapse with other treatments. HOME CARE INSTRUCTIONS   Take medicines only as directed by your health care provider.  Check with your health care provider before starting new medicines.  Keep all follow-up visits as directed by your health care provider. SEEK MEDICAL CARE IF:  You are not able to take your medicines as directed.  Your symptoms get worse. SEEK IMMEDIATE MEDICAL CARE IF:  You have serious thoughts about hurting yourself or others.  You may have taken an overdose of opioids. FOR MORE INFORMATION  National Institute on Drug Abuse: http://www.price-smith.com/www.drugabuse.gov  Substance Abuse and Mental Health Services Administration: SkateOasis.com.ptwww.samhsa.gov Document Released: 05/21/2007 Document Revised: 12/08/2013 Document Reviewed: 08/06/2013 [facility] Patient Information 2015 [place]. This information is not intended to replace advice given to you by your health care provider. Make sure you discuss any questions you have with your health care provider.   Please proceed directly to [facility] for treatment.

## 2015-01-07 NOTE — B[facility] Counselor (Signed)
Referral information faxed to [facility] ([phone]) and confirmed it was received. 

## 2015-01-07 NOTE — [facility] Counselor (Signed)
Pt. Is able to be admitted to [facility] ([phone]). They will call back with a pick up time. Information forwarded to pt. Nurse Carollee Herter(Shannon, RN).

## 2015-01-07 NOTE — BH Assessment (Signed)
Assessment Note  Jerry Spears. is an 64 y.o. male Who presents to the ER seeking assistance with detox from his pain medications. He states he has made attempts to stop but relapsed. He was at [facility] 10/2014 and was able to stay sober for a brief moment. His PCP prescribed him another pain medication but was unable to work as effective.  Pt. states, he has been purchasing his medications from friends due to "canceling" his prescriptions. Pt. requested that his doctor and his pharmacy stop giving him narcotic pain medications. This was his attempt to remain sober.  His reported symptoms of withdrawal are; running nose, diarrhea, cold chills, anxiety, fatigue & Insomnia.   Pt. denies SI/HI and AV/H. He states he is depressed due to his use.  Axis I: Substance Abuse and Substance Induced Mood Disorder Axis III:  Past Medical History  Diagnosis Date  . DDD (degenerative disc disease), lumbar   . Chronic back pain    Axis IV: economic problems, occupational problems, other psychosocial or environmental problems, problems with access to health care services and problems with primary support group  Past Medical History:  Past Medical History  Diagnosis Date  . DDD (degenerative disc disease), lumbar   . Chronic back pain     History reviewed. No pertinent past surgical history.  Family History: No family history on file.  Social History:  reports that he has been smoking Cigarettes.  He does not have any smokeless tobacco history on file. He reports that he does not drink alcohol or use illicit drugs.  Additional Social History:  Alcohol / Drug Use Pain Medications: Vicodin Prescriptions: Percocet Over the Counter: None Reported History of alcohol / drug use?: Yes Longest period of sobriety (when/how long): A month Negative Consequences of Use: Personal relationships Withdrawal Symptoms: Diarrhea, Sweats, Nausea / Vomiting Substance #1 Name of Substance 1: Percocet 1 - Age of  First Use: 55 1 - Amount (size/oz): "What every I can get." 1 - Frequency: Daily 1 - Duration: 8 years 1 - Last Use / Amount: 01/07/2015  CIWA: CIWA-Ar BP: 130/74 mmHg Pulse Rate: 65 COWS:    Allergies: No Known Allergies  Home Medications:  (Not in a hospital admission)  OB/GYN Status:  No LMP for male patient.  General Assessment Data Location of Assessment: [facility] ED TTS Assessment: In system Is this a Tele or Face-to-Face Assessment?: Face-to-Face Is this an Initial Assessment or a Re-assessment for this encounter?: Initial Assessment Marital status: Divorced [place] name: n/a Is patient pregnant?: No Pregnancy Status: No Living Arrangements: Alone Can pt return to current living arrangement?: Yes Admission Status: Voluntary Is patient capable of signing voluntary admission?: Yes Referral Source: Self/Family/Friend Insurance type: IPRS  Medical Screening [facility]am [facility]) Medical Exam completed: Yes  Crisis Care Plan Living Arrangements: Alone Name of Psychiatrist: n/a Name of Therapist: n/a  Education Status Is patient currently in school?: No Current Grade: n/a Highest grade of school patient has completed: 12 Name of school: n/a Contact person: n/a  Risk to self with the past 6 months Suicidal Ideation: No Has patient been a risk to self within the past 6 months prior to admission? : No Suicidal Intent: No Has patient had any suicidal intent within the past 6 months prior to admission? : No Is patient at risk for suicide?: No Suicidal Plan?: No Has patient had any suicidal plan within the past 6 months prior to admission? : No Access to Means: No What has been  your use of drugs/alcohol within the last 12 months?: Percocet & Vicodin  Previous Attempts/Gestures: No How many times?: 0 Other Self Harm Risks: None Reported Triggers for Past Attempts: None known Intentional Self Injurious Behavior: None Family Suicide History: No Recent stressful  life event(s): Other (Comment) (Current Addiction) Persecutory voices/beliefs?: No Depression: Yes Depression Symptoms: Feeling worthless/self pity, Tearfulness, Isolating, Fatigue, Guilt Substance abuse history and/or treatment for substance abuse?: Yes Suicide prevention information given to non-admitted patients: Not applicable  Risk to Others within the past 6 months Homicidal Ideation: No Does patient have any lifetime risk of violence toward others beyond the six months prior to admission? : No Thoughts of Harm to Others: No Current Homicidal Intent: No Current Homicidal Plan: No Access to Homicidal Means: No Identified Victim: None Reported History of harm to others?: No Assessment of Violence: None Noted Violent Behavior Description: None Reported Does patient have access to weapons?: No Criminal Charges Pending?: No Does patient have a court date: No Is patient on probation?: No  Psychosis Hallucinations: None noted Delusions: None noted  Mental Status Report Appearance/Hygiene: In scrubs, In hospital gown, Unremarkable Eye Contact: Good Motor Activity: Freedom of movement, Unremarkable Speech: Logical/coherent Level of Consciousness: Alert Mood: Anxious, Depressed, Sad, Pleasant Affect: Appropriate to circumstance, Anxious, Depressed, Sad Anxiety Level: Minimal Thought Processes: Coherent, Relevant Judgement: Impaired Orientation: Person, Place, Time, Situation, Appropriate for developmental age Obsessive Compulsive Thoughts/Behaviors: Minimal  Cognitive Functioning Concentration: Normal Memory: Recent Intact, Remote Intact IQ: Average Insight: Poor Impulse Control: Poor Appetite: Fair Weight Loss: 0 Weight Gain: 0 Sleep: No Change Total Hours of Sleep: 7 Vegetative Symptoms: None  ADLScreening [facility] Assessment Services) Patient's cognitive ability adequate to safely complete daily activities?: Yes Patient able to express need for assistance with  ADLs?: Yes Independently performs ADLs?: Yes (appropriate for developmental age)  Prior Inpatient Therapy Prior Inpatient Therapy: Yes Prior Therapy Dates: 10/2014 Prior Therapy Facilty/Provider(s): [facility] Reason for Treatment: Detox  Prior Outpatient Therapy Prior Outpatient Therapy: No Prior Therapy Dates: n/a Prior Therapy Facilty/Provider(s): n/a Reason for Treatment: n/a Does patient have an ACCT team?: No Does patient have Intensive In-House Services?  : No Does patient have [facility] services? : No Does patient have P4CC services?: No  ADL Screening (condition at time of admission) Patient's cognitive ability adequate to safely complete daily activities?: Yes Patient able to express need for assistance with ADLs?: Yes Independently performs ADLs?: Yes (appropriate for developmental age)       Abuse/Neglect Assessment (Assessment to be complete while patient is alone) Physical Abuse: Denies Verbal Abuse: Denies Sexual Abuse: Denies Exploitation of patient/patient's resources: Denies Self-Neglect: Denies Values / Beliefs Cultural Requests During Hospitalization: None Spiritual Requests During Hospitalization: None Consults Spiritual Care Consult Needed: No Social Work Consult Needed: No      Additional Information 1:1 In Past 12 Months?: No CIRT Risk: No Elopement Risk: No Does patient have medical clearance?: Yes  Child/Adolescent Assessment Running Away Risk: Denies (Pt. is an adult)  Disposition:  Disposition Initial Assessment Completed for this Encounter: Yes Disposition of Patient: Referred to Patient referred to: [facility]  On Site Evaluation by:   Reviewed with Physician:    Lilyan Gilfordalvin J. Garyn Arlotta, MS, LCAS, LPC, NCC, CCSI 01/07/2015 2:15 PM

## 2015-01-07 NOTE — ED Provider Notes (Signed)
[facility] Emergency Department Provider Note  Time seen: 12:47 PM  I have reviewed the triage vital signs and the nursing notes.   HISTORY  Chief Complaint Drug Problem    HPI Jerry Spears. is a 64 y.o. male with a past medical history of chronic pain who presents to the emergency department hoping to detox off opiates pain medications. According to the patient for the past 9 years she has been taking Percocet approximate 5-8 tablets per day. Patient takes them orally. His last use was this morning. He is hoping to detox off of the pain medication. He states he has tried once before [facility] with some success but has relapsed. Denies any other drug use, denies alcohol use. No medical complaints today.    Past Medical History  Diagnosis Date  . DDD (degenerative disc disease), lumbar   . Chronic back pain     There are no active problems to display for this patient.   History reviewed. No pertinent past surgical history.  Current Outpatient Rx  Name  Route  Sig  Dispense  Refill  . ALPRAZolam (XANAX) 0.5 MG tablet   Oral   Take 0.5 mg by mouth 2 (two) times daily as needed for anxiety (anxiety).         Marland Kitchen. amLODipine (NORVASC) 5 MG tablet   Oral   Take 5 mg by mouth daily.         Marland Kitchen. aspirin 325 MG tablet   Oral   Take 325 mg by mouth every 6 (six) hours as needed. For pain.         . diazepam (VALIUM) 5 MG tablet   Oral   Take 1 tablet (5 mg total) by mouth 2 (two) times daily.   10 tablet   0   . HYDROmorphone (DILAUDID) 2 MG tablet   Oral   Take 1 tablet (2 mg total) by mouth every 6 (six) hours as needed for pain.   20 tablet   0   . ibuprofen (ADVIL,MOTRIN) 200 MG tablet   Oral   Take 400 mg by mouth every 6 (six) hours as needed for headache (headache).         . oxyCODONE-acetaminophen (PERCOCET) 10-325 MG per tablet   Oral   Take 1 tablet by mouth every 4 (four) hours as needed. For pain.            Allergies Review of patient's allergies indicates no known allergies.  No family history on file.  Social History History  Substance Use Topics  . Smoking status: Current Every Day Smoker    Types: Cigarettes  . Smokeless tobacco: Not on file  . Alcohol Use: No    Review of Systems Constitutional: Negative for fever. Cardiovascular: Negative for chest pain. Respiratory: Negative for shortness of breath. Gastrointestinal: Negative for abdominal pain Musculoskeletal: Negative for back pain. Neurological: Negative for headache Psychiatric:Negative for SI/HI  10-point ROS otherwise negative.  ____________________________________________   PHYSICAL EXAM:  VITAL SIGNS: ED Triage Vitals  Enc Vitals Group     BP 01/07/15 1120 130/74 mmHg     Pulse Rate 01/07/15 1120 65     Resp --      Temp 01/07/15 1120 98.3 F (36.8 C)     Temp Source 01/07/15 1120 Oral     SpO2 01/07/15 1120 99 %     Weight 01/07/15 1120 215 lb (97.523 kg)     Height 01/07/15 1120 6\' 3"  (1.905  m)     Head Cir --      Peak Flow --      Pain Score --      Pain Loc --      Pain Edu? --      Excl. in GC? --     Constitutional: Alert and oriented. Well appearing and in no distress. ENT   Head: Normocephalic and atraumatic   Mouth/Throat: Mucous membranes are moist. Cardiovascular: Normal rate, regular rhythm.  Respiratory: Normal respiratory effort without tachypnea nor retractions. Breath sounds are clear  Gastrointestinal: Soft and nontender. No distention.   Musculoskeletal: Nontender with normal range of motion in all extremities. Neurologic:  Normal speech and language. No gross focal neurologic deficits Skin:  Skin is warm, dry and intact, several bruises to upper extremities which patient states he has been getting very easily. Psychiatric: Mood and affect are normal. Speech and behavior are normal.   ____________________________________________   INITIAL IMPRESSION /  ASSESSMENT AND PLAN / ED COURSE  Pertinent labs & imaging results that were available during my care of the patient were reviewed by me and considered in my medical decision making (see chart for details).  Patient here hoping to detox off of opiate pain medications. Patient states he called [facility] this morning and they referred him to the emergency department for medical clearance. Denies any medical problems at this time. We will check labs, and have Behavioral Health see the patient and discussed with [facility] per disposition.   ----------------------------------------- 2:28 PM on 01/07/2015 -----------------------------------------  Labs show positive opiates and benzodiazepine's on drug screen. Otherwise labs are largely within normal limits. Patient currently medically clear awaiting [facility] approval.   ____________________________________________   FINAL CLINICAL IMPRESSION(S) / ED DIAGNOSES  Opiate abuse Opiate withdrawal   [name], MD 01/07/15 1429

## 2015-01-07 NOTE — ED Notes (Signed)
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

## 2015-01-07 NOTE — ED Notes (Signed)
Pt here for medical clearance from percocet. Pt needs cleared to go to [facility].

## 2015-01-07 NOTE — ED Notes (Signed)
BEHAVIORAL HEALTH ROUNDING Patient sleeping: No. Patient alert and oriented: yes Behavior appropriate: Yes.  ;  Nutrition and fluids offered: Yes  Toileting and hygiene offered: Yes  Sitter present: yes Law enforcement present: Yes  

## 2015-01-07 NOTE — ED Notes (Signed)

## 2015-01-07 NOTE — ED Notes (Signed)
BEHAVIORAL HEALTH ROUNDING Patient sleeping: No. Patient alert and oriented: yes Behavior appropriate: Yes.   Nutrition and fluids offered: Yes   Toileting and hygiene offered: Yes  Sitter present: yes Law enforcement present: Yes   Pt waiting for [facility] pickup

## 2015-01-07 NOTE — ED Notes (Signed)
Pt given food tray.

## 2015-07-19 ENCOUNTER — Ambulatory Visit (INDEPENDENT_AMBULATORY_CARE_PROVIDER_SITE_OTHER): Payer: Medicare Other | Admitting: Family Medicine

## 2015-07-19 VITALS — BP 140/82 | HR 65 | Temp 98.3°F | Resp 18 | Ht 75.0 in | Wt 196.0 lb

## 2015-07-19 DIAGNOSIS — F329 Major depressive disorder, single episode, unspecified: Secondary | ICD-10-CM | POA: Diagnosis not present

## 2015-07-19 DIAGNOSIS — I10 Essential (primary) hypertension: Secondary | ICD-10-CM

## 2015-07-19 DIAGNOSIS — G4700 Insomnia, unspecified: Secondary | ICD-10-CM | POA: Diagnosis not present

## 2015-07-19 DIAGNOSIS — F32A Depression, unspecified: Secondary | ICD-10-CM

## 2015-07-19 MED ORDER — CITALOPRAM HYDROBROMIDE 20 MG PO TABS: 20.0000 mg | ORAL_TABLET | Freq: Every day | ORAL | Status: DC

## 2015-07-19 MED ORDER — CLONIDINE HCL 0.1 MG PO TABS: 0.1000 mg | ORAL_TABLET | Freq: Two times a day (BID) | ORAL | Status: DC

## 2015-07-19 MED ORDER — AMLODIPINE BESYLATE 5 MG PO TABS: 5.0000 mg | ORAL_TABLET | Freq: Every day | ORAL | Status: DC

## 2015-07-19 MED ORDER — CLONAZEPAM 1 MG PO TABS: 0.5000 mg | ORAL_TABLET | Freq: Two times a day (BID) | ORAL | Status: DC | PRN

## 2015-07-19 NOTE — Patient Instructions (Addendum)
Jerry Spears, you need to call me if you are not feeling better and sleeping more over the next couple weeks. I'm concerned about home difficult things are for you at this point.   Break the CITALOPRAM (CELEXA) in half, and take 1/2 tablet each morning for the next week. THEN, increase it tot he whole tablet. Use the CLONAZEPAM (KLONOPIN) as needed, 1/2-1 tablet, up to twice a day.

## 2015-07-19 NOTE — Progress Notes (Addendum)
Patient returned to the office reporting that he told Dr. Milus GlazierLauenstein the wrong medication. He asked for medication to help with his stress and insomnia. His father's wife and brother both recently died. His father has dementia and lives in a nursing home. His father also has pneumonia presently.  He asked for citalopram, and it was prescribed. He took it when he got home, prepared to take a nap. Rather than feeling relaxed, he began to feel wired and shaky. He read the label and realized that he meant to ask for clonazepam.  Advised that some of his symptoms may be related to starting the citalopram at 20 mg, and he can take 1/2 tablet daily for the next week, then increase to the whole tablet.  Meds ordered this encounter  Medications  . clonazePAM (KLONOPIN) 1 MG tablet    Sig: Take 0.5-1 tablets (0.5-1 mg total) by mouth 2 (two) times daily as needed for anxiety.    Dispense:  30 tablet    Refill:  0    Order Specific Question:  Supervising Provider    Answer:  DOOLITTLE, ROBERT P [[id number]]

## 2015-07-19 NOTE — Addendum Note (Signed)
Addended by: Fernande BrasJEFFERY, Law Corsino S on: 07/19/2015 03:38 PM   Modules accepted: Orders

## 2015-07-19 NOTE — Progress Notes (Signed)
This chart was scribed for Elvina SidleKurt Sophya Vanblarcom, MD by Stann Oresung-Kai Tsai, medical scribe at [facility].The patient was seen in exam room 10 and the patient's care was started at 11:58 AM.  Patient ID: Jerry Governhomas E Ned Jr. MRN: [id number], DOB: Jul 10, 1951, 64 y.o. Date of Encounter: 07/19/2015  Primary Physician: Jacklynn BarnaclePLACEY,MARY H, NP  Chief Complaint:  Chief Complaint  Patient presents with  . Anxiety    dead in family last week   . Stress  . Insomnia  . Medication Refill    clonodine    HPI:  Jerry Governhomas E Townsel Jr. is a [age] y.o. male who [facility] complaining of gradual onset anxiety with stress and insomnia. He notes that he recently lost someone in his family last week. His father 33(90 y.o) has pneumonia and staying at a nursing home due to dementia. Pt is very stressed with bills, work, and family problems. He hasn't slept 3 nights in a row, and has taken temazepam but has side effect of headaches. He's still smoking and trying to cut back. He denies alcohol usage.   Medication He wants medication refill on his clonidine.  He previously took percocet for pain daily with relief for his back pain.   Personal He's currently living by himself. His first wife was sick in the hospital and his second wife thought that the patient is still in love with his first wife. So, his second wife left him.   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   Past Medical History  Diagnosis Date  . DDD (degenerative disc disease), lumbar   . Chronic back pain   . Anxiety      Home Meds: Prior to Admission medications   Medication Sig Start Date End Date Taking? Authorizing Provider  ALPRAZolam Prudy Feeler(XANAX) 0.5 MG tablet Take 0.5 mg by mouth 2 (two) times daily as needed for anxiety (anxiety).    Historical Provider, MD  amLODipine (NORVASC) 5 MG tablet Take 5 mg by mouth daily.    Historical Provider, MD  aspirin 325 MG tablet Take 325 mg by mouth  every 6 (six) hours as needed. For pain.    Historical Provider, MD  diazepam (VALIUM) 5 MG tablet Take 1 tablet (5 mg total) by mouth 2 (two) times daily. 08/26/12   Linwood DibblesJon Knapp, MD  HYDROmorphone (DILAUDID) 2 MG tablet Take 1 tablet (2 mg total) by mouth every 6 (six) hours as needed for pain. 08/26/12   Linwood DibblesJon Knapp, MD  ibuprofen (ADVIL,MOTRIN) 200 MG tablet Take 400 mg by mouth every 6 (six) hours as needed for headache (headache).    Historical Provider, MD  oxyCODONE-acetaminophen (PERCOCET) 10-325 MG per tablet Take 1 tablet by mouth every 4 (four) hours as needed. For pain.    Historical Provider, MD    Allergies: No Known Allergies  Social History   Social History  . Marital Status: Single    Spouse Name: N/A  . Number of Children: N/A  . Years of Education: N/A   Occupational History  . Not on file.   Social History Main Topics  . Smoking status: Current Every Day Smoker    Types: Cigarettes  . Smokeless tobacco: Not on file  . Alcohol Use: No  . Drug Use: No  . Sexual Activity: Yes    Birth Control/ Protection: None   Other Topics Concern  . Not on file   Social History Narrative     Review  of Systems: Constitutional: negative for fever, chills, night sweats, weight changes, or fatigue  HEENT: negative for vision changes, hearing loss, congestion, rhinorrhea, ST, epistaxis, or sinus pressure Cardiovascular: negative for chest pain or palpitations Respiratory: negative for hemoptysis, wheezing, shortness of breath, or cough Abdominal: negative for abdominal pain, nausea, vomiting, diarrhea, or constipation Dermatological: negative for rash Neurologic: negative for headache, dizziness, or syncope Psych: positive for dysphoric mood, anxious, stress, insomnia All other systems reviewed and are otherwise negative with the exception to those above and in the HPI.  Physical Exam: Blood pressure 140/82, pulse 65, temperature 98.3 F (36.8 C), temperature source Oral,  resp. rate 18, height  (1.905 m), weight 196 lb (88.905 kg), SpO2 98 %., Body mass index is 24.5 kg/(m^2). General: Well developed, well nourished, in no acute distress. Head: Normocephalic, atraumatic, eyes without discharge, sclera non-icteric, nares are without discharge. Bilateral auditory canals clear, TM's are without perforation, pearly grey and translucent with reflective cone of light bilaterally. Oral cavity moist, posterior pharynx without exudate, erythema, peritonsillar abscess, or post nasal drip.  Neck: Supple. No thyromegaly. Full ROM. No lymphadenopathy. Lungs: Clear bilaterally to auscultation without wheezes, rales, or rhonchi. Breathing is unlabored. Heart: RRR with S1 S2. No murmurs, rubs, or gallops appreciated. Msk:  Strength and tone normal for age. Extremities/Skin: Warm and dry. No clubbing or cyanosis. No edema. No rashes or suspicious lesions. Neuro: Alert and oriented X 3. Moves all extremities spontaneously. Gait is normal. CNII-XII grossly in tact. Psych:  Responds to questions appropriately with a depressed, tearful affect.  Patient says he has much to be thankful for, but is still feeling stressed and down.    ASSESSMENT AND PLAN:  64 y.o. year old male with depression and hypertension This chart was scribed in my presence and reviewed by me personally.    ICD-9-CM ICD-10-CM   1. Essential hypertension 401.9 I10 cloNIDine (CATAPRES) 0.1 MG tablet     amLODipine (NORVASC) 5 MG tablet  2. Depression 311 F32.9 citalopram (CELEXA) 20 MG tablet     By signing my name below, I, Stann Ore, attest that this documentation has been prepared under the direction and in the presence of Elvina Sidle, MD. Electronically Signed: Stann Ore, Scribe. 07/19/2015 , 11:58 AM .  Signed, Elvina Sidle, MD 07/19/2015 11:58 AM

## 2015-08-06 ENCOUNTER — Ambulatory Visit (INDEPENDENT_AMBULATORY_CARE_PROVIDER_SITE_OTHER): Payer: Medicare Other | Admitting: Family Medicine

## 2015-08-06 VITALS — BP 140/78 | HR 60 | Temp 97.6°F | Resp 16 | Ht 74.75 in | Wt 204.4 lb

## 2015-08-06 DIAGNOSIS — Z021 Encounter for pre-employment examination: Secondary | ICD-10-CM

## 2015-08-06 DIAGNOSIS — Z024 Encounter for examination for driving license: Secondary | ICD-10-CM

## 2015-08-06 NOTE — Progress Notes (Signed)
Patient ID: Jerry Governhomas E Vandevelde Jr., male    DOB: 05-21-1951  Age: 64 y.o. MRN: [id number]   Chief Complaint  Patient presents with  . Employment Physical    dot    Subjective:   64 year old man here for his DOT examination. He has been a Naval architecttruck driver for 45 years. He is generally fairly healthy. He has a history of hypertension which is controlled. This is been a stressful stretch for him because he has stepmother and his uncle both died in the last couple weeks. His father is 1390, has dementia, and that has been a struggle. The patient has been placed on some medications for his nerves, but is not taking antidepressant. He realizes he cannot take clonazepam when he is driving.  Past medical history: Operations: No major surgeries except for a cyst from his ankle. Medical illnesses: Hypertension Regular medications see list Allergies: None  Social: Lives alone, drives a truck but he has not been driving regularly recently. He just doesn't part-time. He is a [place] and 6 to live a committed life   Review of systems: Unremarkable  Current allergies, medications, problem list, past/family and social histories in chart reviewed.  Objective:  BP 140/78 mmHg  Pulse 60  Temp(Src) 97.6 F (36.4 C) (Oral)  Resp 16  Ht 6' 2.75" (1.899 m)  Wt 204 lb 6.4 oz (92.715 kg)  BMI 25.71 kg/m2  SpO2 95%  Healthy gentleman in no acute distress. Does smell cigarette smoke. His TMs are normal. Eyes PERRLA. Fundi benign. Throat clear. Neck supple without nodes or thyromegaly. No carotid bruit. Chest clear to auscultation. Heart regular without murmurs. Abdomen soft without mass or tenderness. Normal male external genitalia. No hernias. Extremities unremarkable. Pulses a little stronger in the left foot than the right, but both are palpable.  Assessment & Plan:   Assessment: 1. Driver's permit physical examination       Plan: DOT card completed. Return on a when necessary basis.  No orders of  the defined types were placed in this encounter.    No orders of the defined types were placed in this encounter.         Patient Instructions  Return as needed     No Follow-up on file.   HOPPER,DAVID, MD 08/06/2015

## 2015-08-06 NOTE — Patient Instructions (Signed)
Return as needed

## 2015-08-16 ENCOUNTER — Other Ambulatory Visit: Payer: Self-pay | Admitting: Physician Assistant

## 2015-08-16 NOTE — Telephone Encounter (Signed)
Last Rx was written 07/19/2015, so this is a couple of days early.  I am not sure why it's urgent that he get the refill today, but he appeared here about 1 hour after the request was placed.   Printed Rx. Please advise the patient that he needs to see Dr. Milus GlazierLauenstein for additional refills.  Meds ordered this encounter  Medications  . clonazePAM (KLONOPIN) 1 MG tablet    Sig: TAKE ONE-HALF TO 1 WHOLE TABLET TWICE A DAY AS NEEDED FOR ANXIETY    Dispense:  30 tablet    Refill:  0    Not to exceed 5 additional fills before 06/10/2017PT IS OUT OF REFILLS.

## 2015-09-21 ENCOUNTER — Other Ambulatory Visit: Payer: Self-pay | Admitting: Physician Assistant

## 2015-09-21 ENCOUNTER — Other Ambulatory Visit: Payer: Self-pay

## 2015-09-21 NOTE — Telephone Encounter (Signed)
Phil from [facility] called requesting Dr L write Rxs for pt [facility]. The first is gabapentin 100 mg 1 Qhs, the second is for trazadone 50 mg 1-2 Qhs. I will pend them.

## 2015-09-21 NOTE — Telephone Encounter (Signed)
Dr L, it looks like Chelle wrote this last couple of times in your absence, but she wanted the requests to go to you since you are the provider who has seen the pt.

## 2015-09-22 MED ORDER — TRAZODONE HCL 50 MG PO TABS: 50.0000 mg | ORAL_TABLET | Freq: Every day | ORAL | Status: DC

## 2015-09-22 MED ORDER — GABAPENTIN 100 MG PO CAPS: 100.0000 mg | ORAL_CAPSULE | Freq: Every day | ORAL | Status: DC

## 2015-09-23 NOTE — Telephone Encounter (Signed)
Faxed

## 2015-10-13 ENCOUNTER — Ambulatory Visit (INDEPENDENT_AMBULATORY_CARE_PROVIDER_SITE_OTHER): Payer: Medicare Other | Admitting: Family Medicine

## 2015-10-13 VITALS — BP 138/80 | HR 56 | Temp 97.9°F | Resp 18 | Ht 74.0 in | Wt 204.0 lb

## 2015-10-13 DIAGNOSIS — K3189 Other diseases of stomach and duodenum: Secondary | ICD-10-CM

## 2015-10-13 DIAGNOSIS — F4323 Adjustment disorder with mixed anxiety and depressed mood: Secondary | ICD-10-CM | POA: Diagnosis not present

## 2015-10-13 MED ORDER — CLONAZEPAM 1 MG PO TABS: 1.0000 mg | ORAL_TABLET | Freq: Two times a day (BID) | ORAL | Status: DC | PRN

## 2015-10-13 MED ORDER — RANITIDINE HCL 150 MG PO TABS: 150.0000 mg | ORAL_TABLET | Freq: Two times a day (BID) | ORAL | Status: DC

## 2015-10-13 NOTE — Progress Notes (Signed)
By signing my name below, I, Stann Oresung-Kai Tsai, attest that this documentation has been prepared under the direction and in the presence of Elvina SidleKurt Kyrian Stage, MD. Electronically Signed: Stann Oresung-Kai Tsai, Scribe. 10/13/2015 , 3:31 PM .  Patient was seen in room 2 .   Patient ID: Jerry Spears. MRN: [id number], DOB: [birth year], 65 y.o. Date of Encounter: 10/13/2015  Primary Physician: Elvina SidleLAUENSTEIN,Rufina Kimery, MD  Chief Complaint:  Chief Complaint  Patient presents with  . Insomnia  . to talk about his meds  . Medication Refill    clonazepam    HPI:  Jerry Governhomas E Losh Spears. is a 65 y.o. male who presents to [facility] complaining of insomnia due to personal problems.   Personal His father is [facility] starting 5 days ago and he's feeling pretty depressed about it. His father hasn't been drinking any fluids. His first wife was sick in the hospital and recently passed away. He was visiting her and his second wife thought the patient was still in love with his first wife, so she left him. He currently lives by himself.   Abdominal pain He reports having some abdominal pain after not eating as much recently. He's tried taking pepto-bismol without any relief.   Medication He tried taking the trazodone, but it would only lasts for a few hours.  He notes that the clonazepam has been helpful and would help him relax.   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   Past Medical History  Diagnosis Date  . DDD (degenerative disc disease), lumbar   . Chronic back pain   . Anxiety      Home Meds: Prior to Admission medications   Medication Sig Start Date End Date Taking? Authorizing Provider  amLODipine (NORVASC) 5 MG tablet Take 1 tablet (5 mg total) by mouth daily. Patient not taking: Reported on 08/06/2015 07/19/15   Elvina SidleKurt Taten Merrow, MD  aspirin 325 MG tablet Take 325 mg by mouth every 6 (six) hours as needed. For pain.     Historical Provider, MD  citalopram (CELEXA) 20 MG tablet Take 1 tablet (20 mg total) by mouth daily. Patient not taking: Reported on 08/06/2015 07/19/15   Elvina SidleKurt Dayjah Selman, MD  clonazePAM (KLONOPIN) 1 MG tablet TAKE ONE-HALF TO 1 WHOLE TABLET TWICE A DAY AS NEEDED FOR ANXIETY 09/22/15   Elvina SidleKurt Adarsh Mundorf, MD  cloNIDine (CATAPRES) 0.1 MG tablet Take 1 tablet (0.1 mg total) by mouth 2 (two) times daily. 07/19/15   Elvina SidleKurt Jarad Barth, MD  gabapentin (NEURONTIN) 100 MG capsule Take 1 capsule (100 mg total) by mouth at bedtime. 09/22/15   Elvina SidleKurt Vannie Hochstetler, MD  ibuprofen (ADVIL,MOTRIN) 200 MG tablet Take 400 mg by mouth every 6 (six) hours as needed for headache (headache).    Historical Provider, MD  oxyCODONE-acetaminophen (PERCOCET) 10-325 MG per tablet Take 1 tablet by mouth every 4 (four) hours as needed. For pain.    Historical Provider, MD  traZODone (DESYREL) 50 MG tablet Take 1-2 tablets (50-100 mg total) by mouth at bedtime. 09/22/15   Elvina SidleKurt Mirriam Vadala, MD    Allergies: No Known Allergies  Social History   Social History  . Marital Status: Single    Spouse Name: N/A  . Number of Children: N/A  . Years of Education: N/A   Occupational History  . Not on file.   Social History Main Topics  . Smoking status: Current Every Day Smoker    Types: Cigarettes  .  Smokeless tobacco: Not on file  . Alcohol Use: No  . Drug Use: No  . Sexual Activity: Yes    Birth Control/ Protection: None   Other Topics Concern  . Not on file   Social History Narrative     Review of Systems: Constitutional: negative for fever, chills, night sweats, weight changes; positive for fatigue HEENT: negative for vision changes, hearing loss, congestion, rhinorrhea, ST, epistaxis, or sinus pressure Cardiovascular: negative for chest pain or palpitations Respiratory: negative for hemoptysis, wheezing, shortness of breath, or cough Abdominal: negative for nausea, vomiting, diarrhea, or constipation; positive for  abdominal pain Dermatological: negative for rash Neurologic: negative for headache, dizziness, or syncope Psych: positive for insomnia, dysphoric mood  All other systems reviewed and are otherwise negative with the exception to those above and in the HPI.  Physical Exam: Blood pressure 138/80, pulse 56, temperature 97.9 F (36.6 C), temperature source Oral, resp. rate 18, height  (1.88 m), weight 204 lb (92.534 kg), SpO2 98 %., Body mass index is 26.18 kg/(m^2). General: Well developed, well nourished, in no acute distress. Head: Normocephalic, atraumatic, eyes without discharge, sclera non-icteric, nares are without discharge. Bilateral auditory canals clear, TM's are without perforation, pearly grey and translucent with reflective cone of light bilaterally. Oral cavity moist, posterior pharynx without exudate, erythema, peritonsillar abscess, or post nasal drip.  Neck: Supple. No thyromegaly. Full ROM. No lymphadenopathy. Lungs: Clear bilaterally to auscultation without wheezes, rales, or rhonchi. Breathing is unlabored. Heart: RRR with S1 S2. No murmurs, rubs, or gallops appreciated. Abdomen: Soft, non-tender, non-distended. No hepatomegaly. No rebound/guarding. No obvious abdominal masses. Hyperactive bowel sounds Msk:  Strength and tone normal for age. Extremities/Skin: Warm and dry. No clubbing or cyanosis. No edema. No rashes or suspicious lesions. Neuro: Alert and oriented X 3. Moves all extremities spontaneously. Gait is normal. CNII-XII grossly in tact. Psych:  Responds to questions appropriately with a normal affect.   Labs:  ASSESSMENT AND PLAN:  64 y.o. year old male with  This chart was scribed in my presence and reviewed by me personally.    ICD-9-CM ICD-10-CM   1. Adjustment disorder with mixed anxiety and depressed mood 309.28 F43.23 clonazePAM (KLONOPIN) 1 MG tablet  2. Gastric hyperacidity 536.8 K31.89 ranitidine (ZANTAC) 150 MG tablet     Signed, Elvina Sidle, MD    Signed, Elvina Sidle, MD 10/13/2015 3:31 PM

## 2015-11-25 ENCOUNTER — Other Ambulatory Visit: Payer: Self-pay | Admitting: Family Medicine

## 2016-01-16 ENCOUNTER — Other Ambulatory Visit: Payer: Self-pay | Admitting: Family Medicine

## 2016-02-28 ENCOUNTER — Other Ambulatory Visit: Payer: Self-pay | Admitting: Family Medicine

## 2016-03-02 ENCOUNTER — Other Ambulatory Visit: Payer: Self-pay | Admitting: Family Medicine

## 2016-03-02 DIAGNOSIS — F4323 Adjustment disorder with mixed anxiety and depressed mood: Secondary | ICD-10-CM

## 2016-03-04 NOTE — Telephone Encounter (Signed)
Patient will need to follow.  What is his plan.

## 2016-03-04 NOTE — Telephone Encounter (Signed)
I spoke with patient informed him he will need to come in for a follow up.   Patient wanted to know if we could fill it one more time before he domes in.  Patient was advised that this may not be able to done. Patient understood.

## 2016-03-17 ENCOUNTER — Ambulatory Visit (INDEPENDENT_AMBULATORY_CARE_PROVIDER_SITE_OTHER): Payer: Medicare Other | Admitting: Physician Assistant

## 2016-03-17 DIAGNOSIS — F4323 Adjustment disorder with mixed anxiety and depressed mood: Secondary | ICD-10-CM | POA: Diagnosis not present

## 2016-03-17 MED ORDER — CLONAZEPAM 1 MG PO TABS: 1.0000 mg | ORAL_TABLET | Freq: Every day | ORAL | 0 refills | Status: DC | PRN

## 2016-03-17 NOTE — Progress Notes (Signed)
[facility] [address], [place] [address] [phone]  By signing my name below I, Raven Small, attest that this documentation has been prepared under the direction and in the presence of Trena PlattStephanie English PA. Electonically Signed. Raven Small, Scribe 03/17/2016 at 8:40 AM  Date:  03/17/2016   Name:  Jerry Governhomas E Senk Jr.   DOB:  1950-12-07   MRN:  [id number]  PCP:  Elvina SidleLAUENSTEIN,KURT, MD    History of Present Illness: Chief Complaint  Patient presents with   Medication Refill    Clonazepam 1 mg    Jerry Governhomas E Kalter Jr. is a 65 y.o. male with hx of anxiety patient [facility] regarding Clonazepam. Last refill 3/08 by Dr. Milus GlazierLauenstein. Pt takes medication as needed and usually takes half a tablet. States his father passed away 5 months and mother before that. Pt states he tends to worry a lot since his father passing; him and his sister are still dealing with their father's estate and is worrying about finances. He reports occasional loss of appetite. He states medication helps him relax. He is not taking celexa due to possible side effect. Pt states he was evaluated years ago at an urgent care after experiencing chest discomfort and was diagnosed with anxiety. Pt enjoys playing golf and working in the yard, but has not been doing these activities as much. He does not have a social group, but reports having friends and people to talk to such as his sister. He denies depressive symptoms, loss of interest and loss of energy. Pt does not want to meet with a therapist.   There are no active problems to display for this patient.   Past Medical History:  Diagnosis Date   Anxiety    Chronic back pain    DDD (degenerative disc disease), lumbar     No past surgical history on file.  Social History  Substance Use Topics   Smoking status: Current Every Day Smoker    Types: Cigarettes   Smokeless tobacco: Current User   Alcohol use  No    No family history on file.  No Known Allergies  Medication list has been reviewed and updated.  Current Outpatient Prescriptions on File Prior to Visit  Medication Sig Dispense Refill   amLODipine (NORVASC) 5 MG tablet Take 1 tablet (5 mg total) by mouth daily. 30 tablet 5   aspirin 325 MG tablet Take 325 mg by mouth every 6 (six) hours as needed. For pain.     clonazePAM (KLONOPIN) 1 MG tablet Take 1 tablet (1 mg total) by mouth 2 (two) times daily as needed for anxiety. 60 tablet 3   cloNIDine (CATAPRES) 0.1 MG tablet TAKE 1 TABLET (0.1 MG TOTAL) BY MOUTH 2 (TWO) TIMES DAILY. 180 tablet 0   gabapentin (NEURONTIN) 100 MG capsule Take 1 capsule (100 mg total) by mouth at bedtime. 90 capsule 3   ibuprofen (ADVIL,MOTRIN) 200 MG tablet Take 400 mg by mouth every 6 (six) hours as needed for headache (headache).     oxyCODONE-acetaminophen (PERCOCET) 10-325 MG per tablet Take 1 tablet by mouth every 4 (four) hours as needed. For pain.     ranitidine (ZANTAC) 150 MG tablet Take 1 tablet (150 mg total) by mouth 2 (two) times daily. 60 tablet 3   traZODone (DESYREL) 50 MG tablet Take 1-2 tablets (50-100 mg total) by mouth at bedtime. 180 tablet 3   No current facility-administered medications on file  prior to visit.     Review of Systems  Constitutional: Negative for malaise/fatigue.  Psychiatric/Behavioral: Negative for depression. The patient is nervous/anxious.    ROS unremarkable unless otherwise specified.  Physical Examination: BP 130/86 (BP Location: Right Arm, Patient Position: Sitting, Cuff Size: Normal)    Pulse 67    Temp 98.5 F (36.9 C) (Oral)    Resp 16    Ht  (1.905 m)    Wt 196 lb 9.6 oz (89.2 kg)    SpO2 96%    BMI 24.57 kg/m  Ideal Body Weight: ([id number])@  Physical Exam  Constitutional: He is oriented to person, place, and time. He appears well-developed and well-nourished. No distress.  HENT:  Head: Normocephalic and atraumatic.  Eyes:  Conjunctivae and EOM are normal. Pupils are equal, round, and reactive to light.  Cardiovascular: Normal rate, regular rhythm and normal heart sounds.  Exam reveals no gallop and no friction rub.   No murmur heard. Pulmonary/Chest: Effort normal and breath sounds normal. No respiratory distress. He has no decreased breath sounds. He has no wheezes. He has no rhonchi. He has no rales.  Neurological: He is alert and oriented to person, place, and time.  Skin: Skin is warm and dry. He is not diaphoretic.  Psychiatric: He has a normal mood and affect. His behavior is normal.     Assessment and Plan: Jerry Low. is a 65 y.o. male who is here today for medication refill Discussed the situational use of the klonopin.  I have advised him that we will refill for 2 additionals.  Discussed that if he will need this med outside of that time, we need to consider using another medicine, or visit with psychiatry.  He is very adverse to SSRI.  There are current situational stressors that still exist from start of medication. He voiced understanding and will return in 3 months. Declines CBT referral at this time. Decreased the dose to once per day.    Adjustment disorder with mixed anxiety and depressed mood - Plan: clonazePAM (KLONOPIN) 1 MG tablet  Trena Platt, PA-C [facility] 8/11/201710:58 AM  I personally performed the services described in this documentation, which was scribed in my presence. The recorded information has been reviewed and is accurate.

## 2016-03-17 NOTE — Patient Instructions (Signed)
     IF you received an x-ray today, you will receive an invoice from [facility]. Please contact  Radiology at [phone] with questions or concerns regarding your invoice.   IF you received labwork today, you will receive an invoice from [facility]. Please contact [facility] at [phone] with questions or concerns regarding your invoice.   Our billing staff will not be able to assist you with questions regarding bills from these companies.  You will be contacted with the lab results as soon as they are available. The fastest way to get your results is to activate your My Chart account. Instructions are located on the last page of this paperwork. If you have not heard from us regarding the results in 2 weeks, please contact this office.      

## 2016-04-01 DIAGNOSIS — I10 Essential (primary) hypertension: Secondary | ICD-10-CM | POA: Diagnosis not present

## 2016-04-01 DIAGNOSIS — Z79899 Other long term (current) drug therapy: Secondary | ICD-10-CM | POA: Diagnosis not present

## 2016-04-01 DIAGNOSIS — F1110 Opioid abuse, uncomplicated: Secondary | ICD-10-CM | POA: Diagnosis not present

## 2016-04-06 DIAGNOSIS — F1210 Cannabis abuse, uncomplicated: Secondary | ICD-10-CM | POA: Insufficient documentation

## 2016-04-06 DIAGNOSIS — F1123 Opioid dependence with withdrawal: Secondary | ICD-10-CM | POA: Insufficient documentation

## 2016-04-06 DIAGNOSIS — F1310 Sedative, hypnotic or anxiolytic abuse, uncomplicated: Secondary | ICD-10-CM | POA: Insufficient documentation

## 2016-04-16 ENCOUNTER — Other Ambulatory Visit: Payer: Self-pay | Admitting: Physician Assistant

## 2016-04-16 DIAGNOSIS — F4323 Adjustment disorder with mixed anxiety and depressed mood: Secondary | ICD-10-CM

## 2016-04-18 NOTE — Telephone Encounter (Signed)
Faxed

## 2016-05-25 ENCOUNTER — Other Ambulatory Visit: Payer: Self-pay | Admitting: Physician Assistant

## 2016-05-25 DIAGNOSIS — F4323 Adjustment disorder with mixed anxiety and depressed mood: Secondary | ICD-10-CM

## 2016-05-25 NOTE — Telephone Encounter (Signed)
Refilled.  He will have to return for office visit prior to next refill.

## 2016-05-25 NOTE — Telephone Encounter (Signed)
Last OV for this med 8/11, last RF 9/11. Pended.

## 2016-05-26 NOTE — Telephone Encounter (Signed)
Called into pharm and asked them to make a note to tell pt that he needs an OV for more refills.

## 2016-06-05 ENCOUNTER — Other Ambulatory Visit: Payer: Self-pay | Admitting: Physician Assistant

## 2016-07-04 ENCOUNTER — Other Ambulatory Visit: Payer: Self-pay | Admitting: Physician Assistant

## 2016-07-04 DIAGNOSIS — F4323 Adjustment disorder with mixed anxiety and depressed mood: Secondary | ICD-10-CM

## 2016-07-21 ENCOUNTER — Ambulatory Visit (INDEPENDENT_AMBULATORY_CARE_PROVIDER_SITE_OTHER): Payer: Medicare Other | Admitting: Physician Assistant

## 2016-07-21 VITALS — BP 130/80 | HR 65 | Temp 97.4°F | Resp 17 | Ht 75.5 in | Wt 197.0 lb

## 2016-07-21 DIAGNOSIS — R630 Anorexia: Secondary | ICD-10-CM

## 2016-07-21 DIAGNOSIS — F4323 Adjustment disorder with mixed anxiety and depressed mood: Secondary | ICD-10-CM | POA: Diagnosis not present

## 2016-07-21 DIAGNOSIS — R634 Abnormal weight loss: Secondary | ICD-10-CM | POA: Diagnosis not present

## 2016-07-21 MED ORDER — CLONAZEPAM 1 MG PO TABS: 1.0000 mg | ORAL_TABLET | Freq: Every day | ORAL | 0 refills | Status: DC | PRN

## 2016-07-21 MED ORDER — ESCITALOPRAM OXALATE 10 MG PO TABS
10.0000 mg | ORAL_TABLET | Freq: Every day | ORAL | 1 refills | Status: DC
Start: 2016-07-21 — End: 2016-12-10

## 2016-07-21 NOTE — Patient Instructions (Signed)
     IF you received an x-ray today, you will receive an invoice from [facility]. Please contact [facility] at [phone] with questions or concerns regarding your invoice.   IF you received labwork today, you will receive an invoice from [facility]. Please contact [facility] at [phone] with questions or concerns regarding your invoice.   Our billing staff will not be able to assist you with questions regarding bills from these companies.  You will be contacted with the lab results as soon as they are available. The fastest way to get your results is to activate your My Chart account. Instructions are located on the last page of this paperwork. If you have not heard from us regarding the results in 2 weeks, please contact this office.     

## 2016-07-21 NOTE — Progress Notes (Signed)
[facility] [address], [place] [address] [phone]  Date:  07/21/2016   Name:  Jerry Spears.   DOB:  10/17/1950   MRN:  [id number]  PCP:  Robyn Haber, MD    History of Present Illness:  Jerry Spears. is a 65 y.o. male patient who presents to [facility] for medication refill of the klonazepam.  He has noticed a change in appetite.  His stomach feels like it is empty.  He has some abdominal pain.  Feels like it is inside out.  He eats at [employer].  Eats hot vegetables.  Marie Calendar.  He lives alone.  understressors with the first christmas after the death of his father.  Pizza.  Soup.  No blood in stool or black stool.  No nausea.  This has been for about .  He has no loss of interest.  He feels very bored.   He recently started back on taking an opiate.  He had constipation with the medication.  Dr. Kenton Kingfisher, former pcp, had prescribed this prior.  He reports that the gabapentin for his back pain is helpign is helping.   Patient reports that he was taking the leftover percocet "10s" that he had leftover.  He took them for about 3 weeks.  Over that time he felt like he had no interest in getting out of the house.  He has had no adverse side effects to anti-depressants, but has been concerned of the side effects that he has never taken them, even when prescribed.   There are no active problems to display for this patient.   Past Medical History:  Diagnosis Date  . Anxiety   . Chronic back pain   . DDD (degenerative disc disease), lumbar     No past surgical history on file.  Social History  Substance Use Topics  . Smoking status: Current Every Day Smoker    Types: Cigarettes  . Smokeless tobacco: Current User  . Alcohol use No    No family history on file.  No Known Allergies  Medication list has been reviewed and updated.  Current Outpatient Prescriptions on File Prior to Visit  Medication Sig Dispense Refill  . aspirin 325 MG  tablet Take 325 mg by mouth every 6 (six) hours as needed. For pain.    . clonazePAM (KLONOPIN) 1 MG tablet TABLET BY MOUTH DAILYAS NEEDED FOR ANXIETY 30 tablet 0  . cloNIDine (CATAPRES) 0.1 MG tablet TAKE 1 TABLET (0.1 MG TOTAL) BY MOUTH 2 (TWO) TIMES DAILY. 180 tablet 0  . gabapentin (NEURONTIN) 100 MG capsule Take 1 capsule (100 mg total) by mouth at bedtime. 90 capsule 3  . ibuprofen (ADVIL,MOTRIN) 200 MG tablet Take 400 mg by mouth every 6 (six) hours as needed for headache (headache).    . oxyCODONE-acetaminophen (PERCOCET) 10-325 MG per tablet Take 1 tablet by mouth every 4 (four) hours as needed. For pain.    . ranitidine (ZANTAC) 150 MG tablet Take 1 tablet (150 mg total) by mouth 2 (two) times daily. 60 tablet 3  . traZODone (DESYREL) 50 MG tablet Take 1-2 tablets (50-100 mg total) by mouth at bedtime. 180 tablet 3  . amLODipine (NORVASC) 5 MG tablet Take 1 tablet (5 mg total) by mouth daily. (Patient not taking: Reported on 07/21/2016) 30 tablet 5   No current facility-administered medications on file prior to visit.     ROS ROS otherwise unremarkable unless listed above.  Physical Examination: BP 130/80 (BP Location: Right Arm, Patient Position: Sitting, Cuff Size: Normal)   Pulse 65   Temp 97.4 F (36.3 C) (Oral)   Resp 17   Ht 6' 3.5" (1.918 m)   Wt 197 lb (89.4 kg)   SpO2 97%   BMI 24.30 kg/m  Ideal Body Weight: Weight in (lb) to have BMI = 25: 202.3  Physical Exam  Constitutional: He is oriented to person, place, and time. He appears well-developed and well-nourished. No distress.  HENT:  Head: Normocephalic and atraumatic.  Eyes: Conjunctivae and EOM are normal. Pupils are equal, round, and reactive to light.  Cardiovascular: Normal rate.   Pulmonary/Chest: Effort normal. No respiratory distress.  Neurological: He is alert and oriented to person, place, and time.  Skin: Skin is warm and dry. He is not diaphoretic.  Psychiatric: He has a normal mood and affect.  His behavior is normal.     Assessment and Plan: Jerry Spears. is a 65 y.o. male who is here today for medication refill of the klonopin. His anxiety and depression are not well controlled.  He would do better with attempting a medication that can be used daily.  He is now open to this.  We will start with lexapro--69m for 1 week, and increasing to the 164m  He will follow up with me in 4 weeks.  Starting very low to decrease chance of non-compliance.  We will continue the klonopin.  Performing generally labs though change in appetite and symptoms are more than likely due to psychosocial stressors and restart of opiate use. Loss of weight - Plan: CMP14+EGFR, TSH, CBC  Loss of appetite - Plan: CMP14+EGFR, TSH, CBC  Adjustment disorder with mixed anxiety and depressed mood - Plan: clonazePAM (KLONOPIN) 1 MG tablet  StIvar DrapePA-C [facility] 12/15/20177:03 PM  There are no diagnoses linked to this encounter.  StIvar DrapePA-C [facility] 07/21/2016 8:46 AM

## 2016-07-22 LAB — CMP14+EGFR
ALT: 10 IU/L (ref 0–44)
AST: 16 IU/L (ref 0–40)
Albumin/Globulin Ratio: 1.4 (ref 1.2–2.2)
Albumin: 3.9 g/dL (ref 3.6–4.8)
Alkaline Phosphatase: 79 IU/L (ref 39–117)
BUN/Creatinine Ratio: 21 (ref 10–24)
BUN: 24 mg/dL (ref 8–27)
Bilirubin Total: 0.4 mg/dL (ref 0.0–1.2)
CALCIUM: 9 mg/dL (ref 8.6–10.2)
CO2: 21 mmol/L (ref 18–29)
CREATININE: 1.14 mg/dL (ref 0.76–1.27)
Chloride: 103 mmol/L (ref 96–106)
GFR, EST AFRICAN AMERICAN: 78 mL/min/{1.73_m2} (ref >59)
GFR, EST NON AFRICAN AMERICAN: 67 mL/min/{1.73_m2} (ref >59)
GLUCOSE: 93 mg/dL (ref 65–99)
Globulin, Total: 2.8 g/dL (ref 1.5–4.5)
Potassium: 4.4 mmol/L (ref 3.5–5.2)
Sodium: 139 mmol/L (ref 134–144)
TOTAL PROTEIN: 6.7 g/dL (ref 6.0–8.5)

## 2016-07-22 LAB — CBC
HEMOGLOBIN: 14.9 g/dL (ref 13.0–17.7)
Hematocrit: 43.1 % (ref 37.5–51.0)
MCH: 33.6 pg — AB (ref 26.6–33.0)
MCHC: 34.6 g/dL (ref 31.5–35.7)
MCV: 97 fL (ref 79–97)
Platelets: 218 10*3/uL (ref 150–379)
RBC: 4.43 x10E6/uL (ref 4.14–5.80)
RDW: 13.6 % (ref 12.3–15.4)
WBC: 5.8 10*3/uL (ref 3.4–10.8)

## 2016-07-22 LAB — TSH: TSH: 2.11 u[IU]/mL (ref 0.450–4.500)

## 2016-08-03 ENCOUNTER — Ambulatory Visit (INDEPENDENT_AMBULATORY_CARE_PROVIDER_SITE_OTHER): Payer: Self-pay | Admitting: Physician Assistant

## 2016-08-03 ENCOUNTER — Encounter: Payer: Self-pay | Admitting: Physician Assistant

## 2016-08-03 VITALS — BP 138/84 | HR 79 | Temp 99.1°F | Resp 18 | Ht 75.5 in | Wt 200.0 lb

## 2016-08-03 DIAGNOSIS — Z0289 Encounter for other administrative examinations: Secondary | ICD-10-CM

## 2016-08-03 NOTE — Progress Notes (Signed)
[facility] [address], [place] [address] [phone]  Date:  08/03/2016   Name:  Jerry Governhomas E Able Jr.   DOB:  1950-11-11   MRN:  [id number]  PCP:  Elvina SidleKurt Lauenstein, MD    History of Present Illness:  Jerry Governhomas E Wach Jr. is a 65 y.o. male patient who [facility].   No complaints or concerns at this time. Hx of htn   There are no active problems to display for this patient.   Past Medical History:  Diagnosis Date  . Anxiety   . Chronic back pain   . DDD (degenerative disc disease), lumbar     History reviewed. No pertinent surgical history.  Social History  Substance Use Topics  . Smoking status: Current Every Day Smoker    Types: Cigarettes  . Smokeless tobacco: Current User  . Alcohol use No    History reviewed. No pertinent family history.  No Known Allergies  Medication list has been reviewed and updated.  Current Outpatient Prescriptions on File Prior to Visit  Medication Sig Dispense Refill  . aspirin 325 MG tablet Take 325 mg by mouth every 6 (six) hours as needed. For pain.    . clonazePAM (KLONOPIN) 1 MG tablet Take 1 tablet (1 mg total) by mouth daily as needed for anxiety. 30 tablet 0  . cloNIDine (CATAPRES) 0.1 MG tablet TAKE 1 TABLET (0.1 MG TOTAL) BY MOUTH 2 (TWO) TIMES DAILY. 180 tablet 0  . ibuprofen (ADVIL,MOTRIN) 200 MG tablet Take 400 mg by mouth every 6 (six) hours as needed for headache (headache).    Marland Kitchen. amLODipine (NORVASC) 5 MG tablet Take 1 tablet (5 mg total) by mouth daily. (Patient not taking: Reported on 08/03/2016) 30 tablet 5  . escitalopram (LEXAPRO) 10 MG tablet Take 1 tablet (10 mg total) by mouth daily. Start with half tablet for 1 week, increase to full tablet after 1 week. (Patient not taking: Reported on 08/03/2016) 60 tablet 1  . gabapentin (NEURONTIN) 100 MG capsule Take 1 capsule (100 mg total) by mouth at bedtime. (Patient not taking: Reported on 08/03/2016) 90 capsule 3  .  oxyCODONE-acetaminophen (PERCOCET) 10-325 MG per tablet Take 1 tablet by mouth every 4 (four) hours as needed. For pain.    . ranitidine (ZANTAC) 150 MG tablet Take 1 tablet (150 mg total) by mouth 2 (two) times daily. (Patient not taking: Reported on 08/03/2016) 60 tablet 3  . traZODone (DESYREL) 50 MG tablet Take 1-2 tablets (50-100 mg total) by mouth at bedtime. (Patient not taking: Reported on 08/03/2016) 180 tablet 3   No current facility-administered medications on file prior to visit.     Review of Systems  Constitutional: Negative for chills and fever.  HENT: Negative for ear discharge, ear pain and sore throat.   Eyes: Negative for blurred vision and double vision.  Respiratory: Negative for cough, shortness of breath and wheezing.   Cardiovascular: Negative for chest pain, palpitations and leg swelling.  Gastrointestinal: Negative for diarrhea, nausea and vomiting.  Genitourinary: Negative for dysuria, frequency and hematuria.  Skin: Negative for itching and rash.  Neurological: Negative for dizziness and headaches.     Physical Examination: BP 138/84   Pulse 79   Temp 99.1 F (37.3 C) (Oral)   Resp 18   Ht 6' 3.5" (1.918 m)   Wt 200 lb (90.7 kg)   SpO2 98%   BMI 24.67 kg/m  Ideal Body Weight: Weight in (lb) to  have BMI = 25: 202.3  Physical Exam  Constitutional: He is oriented to person, place, and time. He appears well-developed and well-nourished. No distress.  HENT:  Head: Normocephalic and atraumatic.  Right Ear: Tympanic membrane, external ear and ear canal normal.  Left Ear: Tympanic membrane, external ear and ear canal normal.  Eyes: Conjunctivae and EOM are normal. Pupils are equal, round, and reactive to light.  Cardiovascular: Normal rate and regular rhythm.  Exam reveals no friction rub.   No murmur heard. Pulmonary/Chest: Effort normal. No respiratory distress. He has no wheezes.  Abdominal: Soft. Bowel sounds are normal. He exhibits no distension and  no mass. There is no tenderness.  Musculoskeletal: Normal range of motion. He exhibits no edema or tenderness.  Neurological: He is alert and oriented to person, place, and time. He displays normal reflexes.  Skin: Skin is warm and dry. He is not diaphoretic.  Psychiatric: He has a normal mood and affect. His behavior is normal.     Assessment and Plan: Jerry Governhomas E Nott Jr. is a 65 y.o. male who is here today for dot. 1 year certification due to htn.    Encounter for examination required by Department of Transportation (DOT)    Trena PlattStephanie Lucinda Spells, PA-C [facility] 08/03/2016 12:25 PM

## 2016-08-03 NOTE — Patient Instructions (Signed)
     IF you received an x-ray today, you will receive an invoice from [facility]. Please contact [facility] at [phone] with questions or concerns regarding your invoice.   IF you received labwork today, you will receive an invoice from [facility]. Please contact [facility] at [phone] with questions or concerns regarding your invoice.   Our billing staff will not be able to assist you with questions regarding bills from these companies.  You will be contacted with the lab results as soon as they are available. The fastest way to get your results is to activate your My Chart account. Instructions are located on the last page of this paperwork. If you have not heard from us regarding the results in 2 weeks, please contact this office.     

## 2016-08-15 ENCOUNTER — Ambulatory Visit: Payer: Medicare Other | Admitting: Physician Assistant

## 2016-08-22 ENCOUNTER — Other Ambulatory Visit: Payer: Self-pay | Admitting: Physician Assistant

## 2016-08-22 DIAGNOSIS — F4323 Adjustment disorder with mixed anxiety and depressed mood: Secondary | ICD-10-CM

## 2016-09-08 ENCOUNTER — Other Ambulatory Visit: Payer: Self-pay | Admitting: Physician Assistant

## 2016-09-12 ENCOUNTER — Ambulatory Visit: Payer: Medicare Other | Admitting: Physician Assistant

## 2016-09-19 ENCOUNTER — Emergency Department (HOSPITAL_COMMUNITY)
Admission: EM | Admit: 2016-09-19 | Discharge: 2016-09-19 | Disposition: A | Discharge status: Home / Self Care | Payer: Self-pay | Attending: Dermatology | Admitting: Dermatology

## 2016-09-19 DIAGNOSIS — Z5321 Procedure and treatment not carried out due to patient leaving prior to being seen by health care provider: Secondary | ICD-10-CM | POA: Insufficient documentation

## 2016-09-19 DIAGNOSIS — K5900 Constipation, unspecified: Secondary | ICD-10-CM | POA: Insufficient documentation

## 2016-09-19 DIAGNOSIS — R200 Anesthesia of skin: Secondary | ICD-10-CM | POA: Insufficient documentation

## 2016-09-19 DIAGNOSIS — F419 Anxiety disorder, unspecified: Secondary | ICD-10-CM | POA: Insufficient documentation

## 2016-09-19 NOTE — ED Notes (Signed)
Pt was called for room placement x2.

## 2016-09-19 NOTE — ED Notes (Signed)
Called pt for room placement no response. 

## 2016-09-19 NOTE — ED Triage Notes (Signed)
Patient reports that he began having numbness of both hands while getting his hair cut. Patient reports that he had dryness of his mouth because he takes a lot of pain meds for his chronic back pain and when he can not swallow he gets anxious/has an anxiety attack and then his hand became numb. Patient states he did not take Clonazepam that is prescribed for anxiety today. Patient states he has not had a BM in over a week. Patient states he has been taking Miralax with no results.

## 2016-10-24 ENCOUNTER — Ambulatory Visit (INDEPENDENT_AMBULATORY_CARE_PROVIDER_SITE_OTHER): Payer: Medicare Other | Admitting: Physician Assistant

## 2016-10-24 ENCOUNTER — Encounter: Payer: Self-pay | Admitting: Physician Assistant

## 2016-10-24 VITALS — BP 138/80 | HR 61 | Temp 98.3°F | Ht 75.5 in | Wt 203.6 lb

## 2016-10-24 DIAGNOSIS — J22 Unspecified acute lower respiratory infection: Secondary | ICD-10-CM | POA: Diagnosis not present

## 2016-10-24 MED ORDER — ALBUTEROL SULFATE HFA 108 (90 BASE) MCG/ACT IN AERS: 2.0000 | INHALATION_SPRAY | RESPIRATORY_TRACT | 1 refills | Status: DC | PRN

## 2016-10-24 MED ORDER — GUAIFENESIN ER 1200 MG PO TB12: 1.0000 | ORAL_TABLET | Freq: Two times a day (BID) | ORAL | 1 refills | Status: AC | PRN

## 2016-10-24 MED ORDER — AZITHROMYCIN 250 MG PO TABS: ORAL_TABLET | ORAL | 0 refills | Status: AC

## 2016-10-24 MED ORDER — HYDROCOD POLST-CPM POLST ER 10-8 MG/5ML PO SUER: 5.0000 mL | Freq: Every evening | ORAL | 0 refills | Status: DC | PRN

## 2016-10-24 MED ORDER — BENZONATATE 100 MG PO CAPS: 100.0000 mg | ORAL_CAPSULE | Freq: Three times a day (TID) | ORAL | 0 refills | Status: AC | PRN

## 2016-10-24 NOTE — Progress Notes (Signed)
[facility] [address], [place] [address] [phone]  Date:  10/24/2016   Name:  Jerry Governhomas E Saline Jr.   DOB:  1951-01-28   MRN:  [id number]  PCP:  No primary care provider on file.    History of Present Illness:  Jerry Governhomas E Sytsma Jr. is a 66 y.o. male patient who presents to PCP with  Chief Complaint  Patient presents with  . Cough    X 4 days with runny nose and yellow mucus/phlegm     Patient reports productive cough of yellow phlegm for 5 days. He has runny nose.   He has had some windedness and used an inhaler to help with his symptoms, which helped. Subjective fever and chills. No facial pain. No ear pain Mild sore throat. Depression/anxiety: currently not taking the lexapro as it made him feel weird the first time he took it.  He does not like to take "head pills".  Continues to take the clonazepam as needed.     Wt Readings from Last 3 Encounters:  10/24/16 203 lb 9.6 oz (92.4 kg)  08/03/16 200 lb (90.7 kg)  07/21/16 197 lb (89.4 kg)     There are no active problems to display for this patient.   Past Medical History:  Diagnosis Date  . Anxiety   . Chronic back pain   . DDD (degenerative disc disease), lumbar     History reviewed. No pertinent surgical history.  Social History  Substance Use Topics  . Smoking status: Current Every Day Smoker    Types: Cigarettes  . Smokeless tobacco: Current User  . Alcohol use No    History reviewed. No pertinent family history.  No Known Allergies  Medication list has been reviewed and updated.  Current Outpatient Prescriptions on File Prior to Visit  Medication Sig Dispense Refill  . aspirin 325 MG tablet Take 325 mg by mouth every 6 (six) hours as needed. For pain.    . clonazePAM (KLONOPIN) 1 MG tablet TAKE 1 TABLET EVERY DAY AS NEEDED FOR ANXIETY 30 tablet 0  . cloNIDine (CATAPRES) 0.1 MG tablet TAKE 1 TABLET (0.1 MG TOTAL) BY MOUTH 2 (TWO) TIMES DAILY. 180 tablet 0  . gabapentin  (NEURONTIN) 100 MG capsule Take 1 capsule (100 mg total) by mouth at bedtime. 90 capsule 3  . ibuprofen (ADVIL,MOTRIN) 200 MG tablet Take 400 mg by mouth every 6 (six) hours as needed for headache (headache).    . oxyCODONE-acetaminophen (PERCOCET) 10-325 MG per tablet Take 1 tablet by mouth every 4 (four) hours as needed. For pain.    Marland Kitchen. amLODipine (NORVASC) 5 MG tablet Take 1 tablet (5 mg total) by mouth daily. (Patient not taking: Reported on 08/03/2016) 30 tablet 5  . escitalopram (LEXAPRO) 10 MG tablet Take 1 tablet (10 mg total) by mouth daily. Start with half tablet for 1 week, increase to full tablet after 1 week. (Patient not taking: Reported on 08/03/2016) 60 tablet 1  . ranitidine (ZANTAC) 150 MG tablet Take 1 tablet (150 mg total) by mouth 2 (two) times daily. (Patient not taking: Reported on 08/03/2016) 60 tablet 3  . traZODone (DESYREL) 50 MG tablet Take 1-2 tablets (50-100 mg total) by mouth at bedtime. (Patient not taking: Reported on 08/03/2016) 180 tablet 3   No current facility-administered medications on file prior to visit.     ROS ROS otherwise unremarkable unless listed above.  Physical Examination: BP 138/80 (BP Location: Right Arm, Patient Position: Sitting, Cuff Size:  Small)   Pulse 61   Temp 98.3 F (36.8 C) (Oral)   Ht 6' 3.5" (1.918 m)   Wt 203 lb 9.6 oz (92.4 kg)   SpO2 97%   BMI 25.11 kg/m  Ideal Body Weight: Weight in (lb) to have BMI = 25: 202.3  Physical Exam  Constitutional: He is oriented to person, place, and time. He appears well-developed and well-nourished. No distress.  HENT:  Head: Atraumatic.  Right Ear: Tympanic membrane, external ear and ear canal normal.  Left Ear: Tympanic membrane, external ear and ear canal normal.  Nose: Mucosal edema and rhinorrhea present. Right sinus exhibits no maxillary sinus tenderness and no frontal sinus tenderness. Left sinus exhibits no maxillary sinus tenderness and no frontal sinus tenderness.   Mouth/Throat: No uvula swelling. No oropharyngeal exudate, posterior oropharyngeal edema or posterior oropharyngeal erythema.  Eyes: Conjunctivae, EOM and lids are normal. Pupils are equal, round, and reactive to light. Right eye exhibits normal extraocular motion. Left eye exhibits normal extraocular motion.  Neck: Trachea normal and full passive range of motion without pain. No edema and no erythema present.  Cardiovascular: Normal rate.   Pulmonary/Chest: Effort normal. No accessory muscle usage. No apnea. No respiratory distress. He has no decreased breath sounds. He has no wheezes. He has rhonchi.  Neurological: He is alert and oriented to person, place, and time.  Skin: Skin is warm and dry. He is not diaphoretic.  Psychiatric: He has a normal mood and affect. His behavior is normal.     Assessment and Plan: Jerry Spears. is a 66 y.o. male who is here today for cc of rhonchi and wheezing. Lower respiratory infection (e.g., bronchitis, pneumonia, pneumonitis, pulmonitis) - Plan: azithromycin (ZITHROMAX) 250 MG tablet, Guaifenesin (MUCINEX MAXIMUM STRENGTH) 1200 MG TB12, albuterol (PROVENTIL HFA;VENTOLIN HFA) 108 (90 Base) MCG/ACT inhaler, benzonatate (TESSALON) 100 MG capsule, chlorpheniramine-HYDROcodone (TUSSIONEX PENNKINETIC ER) 10-8 MG/5ML SUER  Trena Platt, PA-C [facility] 3/21/20188:36 AM

## 2016-10-24 NOTE — Patient Instructions (Addendum)
Make sure you are hydrating well with 64 oz of water per day Careful of the side effects of the tussionex, with walking at night.     Community-Acquired Pneumonia, Adult Pneumonia is an infection of the lungs. One type of pneumonia can happen while a person is in a hospital. A different type can happen when a person is not in a hospital (community-acquired pneumonia). It is easy for this kind to spread from person to person. It can spread to you if you breathe near an infected person who coughs or sneezes. Some symptoms include:  A dry cough.  A wet (productive) cough.  Fever.  Sweating.  Chest pain. Follow these instructions at home:  Take over-the-counter and prescription medicines only as told by your doctor.  Only take cough medicine if you are losing sleep.  If you were prescribed an antibiotic medicine, take it as told by your doctor. Do not stop taking the antibiotic even if you start to feel better.  Sleep with your head and neck raised (elevated). You can do this by putting a few pillows under your head, or you can sleep in a recliner.  Do not use tobacco products. These include cigarettes, chewing tobacco, and e-cigarettes. If you need help quitting, ask your doctor.  Drink enough water to keep your pee (urine) clear or pale yellow. A shot (vaccine) can help prevent pneumonia. Shots are often suggested for:  People older than 67 years of age.  People older than 67 years of age:  Who are having cancer treatment.  Who have long-term (chronic) lung disease.  Who have problems with their body's defense system (immune system). You may also prevent pneumonia if you take these actions:  Get the flu (influenza) shot every year.  Go to the dentist as often as told.  Wash your hands often. If soap and water are not available, use hand sanitizer. Contact a doctor if:  You have a fever.  You lose sleep because your cough medicine does not help. Get help right away  if:  You are short of breath and it gets worse.  You have more chest pain.  Your sickness gets worse. This is very serious if:  You are an older adult.  Your body's defense system is weak.  You cough up blood. This information is not intended to replace advice given to you by your health care provider. Make sure you discuss any questions you have with your health care provider. Document Released: 01/10/2008 Document Revised: 12/30/2015 Document Reviewed: 11/18/2014 Elsevier Interactive Patient Education  2017 [employer]    IF you received an x-ray today, you will receive an invoice from [facility]. Please contact [facility] at [phone] with questions or concerns regarding your invoice.   IF you received labwork today, you will receive an invoice from [facility]. Please contact [facility] at [phone] with questions or concerns regarding your invoice.   Our billing staff will not be able to assist you with questions regarding bills from these companies.  You will be contacted with the lab results as soon as they are available. The fastest way to get your results is to activate your My Chart account. Instructions are located on the last page of this paperwork. If you have not heard from us regarding the results in 2 weeks, please contact this office.

## 2016-12-08 ENCOUNTER — Other Ambulatory Visit: Payer: Self-pay | Admitting: Physician Assistant

## 2016-12-09 ENCOUNTER — Other Ambulatory Visit: Payer: Self-pay | Admitting: Physician Assistant

## 2016-12-10 ENCOUNTER — Other Ambulatory Visit: Payer: Self-pay | Admitting: Physician Assistant

## 2016-12-10 DIAGNOSIS — F4323 Adjustment disorder with mixed anxiety and depressed mood: Secondary | ICD-10-CM

## 2016-12-12 ENCOUNTER — Other Ambulatory Visit: Payer: Self-pay | Admitting: Physician Assistant

## 2016-12-12 DIAGNOSIS — F4323 Adjustment disorder with mixed anxiety and depressed mood: Secondary | ICD-10-CM

## 2016-12-14 ENCOUNTER — Ambulatory Visit (INDEPENDENT_AMBULATORY_CARE_PROVIDER_SITE_OTHER): Payer: Medicare Other

## 2016-12-14 ENCOUNTER — Encounter: Payer: Self-pay | Admitting: Physician Assistant

## 2016-12-14 ENCOUNTER — Ambulatory Visit (INDEPENDENT_AMBULATORY_CARE_PROVIDER_SITE_OTHER): Payer: Medicare Other | Admitting: Physician Assistant

## 2016-12-14 VITALS — BP 120/66 | HR 70 | Temp 98.9°F | Resp 16 | Ht 73.0 in | Wt 199.6 lb

## 2016-12-14 DIAGNOSIS — M79671 Pain in right foot: Secondary | ICD-10-CM | POA: Diagnosis not present

## 2016-12-14 DIAGNOSIS — M79672 Pain in left foot: Secondary | ICD-10-CM

## 2016-12-14 MED ORDER — MELOXICAM 15 MG PO TABS: 15.0000 mg | ORAL_TABLET | Freq: Every day | ORAL | 0 refills | Status: DC

## 2016-12-14 NOTE — Patient Instructions (Addendum)
Please ice the foot three times per day for 15 minutes I would like you to use the mobic at this time.  Do not use ibuprofen or tylenol with this medication.  You able to use tylenol.   Foot Pain Many things can cause foot pain. Some common causes are:  An injury.  A sprain.  Arthritis.  Blisters.  Bunions. Follow these instructions at home: Pay attention to any changes in your symptoms. Take these actions to help with your discomfort:  If directed, put ice on the affected area:  Put ice in a plastic bag.  Place a towel between your skin and the bag.  Leave the ice on for 15-20 minutes, 3?4 times a day for 2 days.  Take over-the-counter and prescription medicines only as told by your health care provider.  Wear comfortable, supportive shoes that fit you well. Do not wear high heels.  Do not stand or walk for long periods of time.  Do not lift a lot of weight. This can put added pressure on your feet.  Do stretches to relieve foot pain and stiffness as told by your health care provider.  Rub your foot gently.  Keep your feet clean and dry. Contact a health care provider if:  Your pain does not get better after a few days of self-care.  Your pain gets worse.  You cannot stand on your foot. Get help right away if:  Your foot is numb or tingling.  Your foot or toes are swollen.  Your foot or toes turn white or blue.  You have warmth and redness along your foot. This information is not intended to replace advice given to you by your health care provider. Make sure you discuss any questions you have with your health care provider. Document Released: 08/20/2015 Document Revised: 12/30/2015 Document Reviewed: 08/19/2014 Elsevier Interactive Patient Education  2017 [employer]    IF you received an x-ray today, you will receive an invoice from [facility]. Please contact [facility] at [phone] with questions or concerns regarding your  invoice.   IF you received labwork today, you will receive an invoice from [facility]. Please contact [facility] at [phone] with questions or concerns regarding your invoice.   Our billing staff will not be able to assist you with questions regarding bills from these companies.  You will be contacted with the lab results as soon as they are available. The fastest way to get your results is to activate your My Chart account. Instructions are located on the last page of this paperwork. If you have not heard from us regarding the results in 2 weeks, please contact this office.

## 2016-12-14 NOTE — Progress Notes (Signed)
[facility] [address], [place] [id number] [phone]  Date:  12/14/2016   Name:  Jerry Governhomas E Calandra Jr.   DOB:  08/01/1951   MRN:  [id number]  PCP:  Garnetta BuddyEnglish, Stephanie D, PA    History of Present Illness:  Jerry Governhomas E Kellett Jr. is a 66 y.o. male patient who presents to PCP with  Chief Complaint  Patient presents with  . Foot Pain    BOTH x 2 weeks     2 weeks ago, patient has noticed increased pain of both feet, however his cc is that he has more pain at the left foot.  He has noticed a mild swelling.  Pain is moreso at the ball of the left foot and also at his 5th toe at the base.  He thinks he may have some tingling.  Pain progressively worsened when he stepped on a branch at home.  He has noticed that he is ambulating more with doing yardwork around the house.  He has no lacerations. No past trauma to the foot, but did have a cyst at the left ankle which was removed several years ago. He has taken percocet for his pain, which helped. He has no hx of gout. He hashe has progressive foot pain     There are no active problems to display for this patient.   Past Medical History:  Diagnosis Date  . Anxiety   . Chronic back pain   . DDD (degenerative disc disease), lumbar     No past surgical history on file.  Social History  Substance Use Topics  . Smoking status: Current Every Day Smoker    Types: Cigarettes  . Smokeless tobacco: Current User  . Alcohol use No    No family history on file.  No Known Allergies  Medication list has been reviewed and updated.  Current Outpatient Prescriptions on File Prior to Visit  Medication Sig Dispense Refill  . albuterol (PROVENTIL HFA;VENTOLIN HFA) 108 (90 Base) MCG/ACT inhaler Inhale 2 puffs into the lungs every 4 (four) hours as needed for wheezing or shortness of breath (cough, shortness of breath or wheezing.). 1 Inhaler 1  . aspirin 325 MG tablet Take 325 mg by mouth every 6 (six) hours as needed. For pain.     . cloNIDine (CATAPRES) 0.1 MG tablet TAKE 1 TABLET (0.1 MG TOTAL) BY MOUTH 2 (TWO) TIMES DAILY. 180 tablet 0  . gabapentin (NEURONTIN) 100 MG capsule Take 1 capsule (100 mg total) by mouth at bedtime. 90 capsule 3  . gabapentin (NEURONTIN) 300 MG capsule TAKE ONE CAPSULE BY MOUTH 3 TIMES A DAY AT BEDTIME 90 capsule 0  . ibuprofen (ADVIL,MOTRIN) 200 MG tablet Take 400 mg by mouth every 6 (six) hours as needed for headache (headache).    . oxyCODONE-acetaminophen (PERCOCET) 10-325 MG per tablet Take 1 tablet by mouth every 4 (four) hours as needed. For pain.    Marland Kitchen. amLODipine (NORVASC) 5 MG tablet Take 1 tablet (5 mg total) by mouth daily. (Patient not taking: Reported on 08/03/2016) 30 tablet 5  . clonazePAM (KLONOPIN) 1 MG tablet TAKE 1 TABLET EVERY DAY AS NEEDED FOR ANXIETY (Patient not taking: Reported on 12/14/2016) 30 tablet 0  . escitalopram (LEXAPRO) 10 MG tablet Take 1 tablet (10 mg total) by mouth daily. (Patient not taking: Reported on 12/14/2016) 30 tablet 0  . ranitidine (ZANTAC) 150 MG tablet Take 1 tablet (150 mg total) by mouth 2 (two) times daily. (Patient not taking: Reported  on 08/03/2016) 60 tablet 3  . traZODone (DESYREL) 50 MG tablet Take 1-2 tablets (50-100 mg total) by mouth at bedtime. (Patient not taking: Reported on 08/03/2016) 180 tablet 3   No current facility-administered medications on file prior to visit.     ROS ROS otherwise unremarkable unless listed above.  Physical Examination: BP 120/66 (BP Location: Right Arm, Patient Position: Sitting, Cuff Size: Large)   Pulse 70   Temp 98.9 F (37.2 C) (Oral)   Resp 16   Ht 6\' 1"  (1.854 m)   Wt 199 lb 9.6 oz (90.5 kg)   SpO2 95%   BMI 26.33 kg/m  Ideal Body Weight: Weight in (lb) to have BMI = 25: 189.1  Physical Exam  Constitutional: He is oriented to person, place, and time. He appears well-developed and well-nourished. No distress.  HENT:  Head: Normocephalic and atraumatic.  Eyes: Conjunctivae and EOM are  normal. Pupils are equal, round, and reactive to light.  Cardiovascular: Normal rate.   Pulmonary/Chest: Effort normal. No respiratory distress.  Musculoskeletal:       Left foot: There is no tenderness, no swelling and normal capillary refill.  Fifth metatarsal base more prominent.    Neurological: He is alert and oriented to person, place, and time.  Skin: Skin is warm and dry. He is not diaphoretic.  Psychiatric: He has a normal mood and affect. His behavior is normal.   Dg Foot Complete Left  Result Date: 12/14/2016 CLINICAL DATA:  Medial LEFT foot pain at ball of foot as well as at the fifth metatarsal EXAM: LEFT FOOT - COMPLETE 3+ VIEW COMPARISON:  None FINDINGS: Osseous mineralization normal. Joint spaces preserved. No acute fracture, dislocation or bone destruction. IMPRESSION: No acute osseous abnormalities. Electronically Signed   By: Jerry Southward M.D.   On: 12/14/2016 08:55   Assessment and Plan: Jerry Bautch. is a 66 y.o. male who is here today for cc of foot pain. Advised ice, and mobic to rid inflammation. He will use shoe insoles.  For the chronic issue of foot pain, he would like a referral to podiatry and I am in agreement. Pain in both feet - Plan: DG Foot Complete Left, meloxicam (MOBIC) 15 MG tablet, Ambulatory referral to Podiatry  Trena Platt, PA-C [facility] 5/10/20186:45 PM

## 2016-12-15 NOTE — Telephone Encounter (Signed)
PATIENT STATES HE SAW STEPHANIE ON THURS. (12/14/16) FOR SOMETHING DIFFERENT, BUT HE FORGOT TO ASK HER TO REFILL HIS CLONAZEPAM (KLONOPIN) 1 MG FOR HIS ANXIETY. HE WAS JUST FOLLOWING UP BECAUSE THE PHARMACY TOLD HIM THEY HAD SENT A REQUEST. BEST PHONE [phone] (CELL) MBC

## 2016-12-28 ENCOUNTER — Telehealth: Payer: Self-pay

## 2016-12-28 NOTE — Telephone Encounter (Signed)
Left message to schedule Medicare Annual Wellness Visit -nr 

## 2016-12-29 ENCOUNTER — Ambulatory Visit (INDEPENDENT_AMBULATORY_CARE_PROVIDER_SITE_OTHER): Payer: Medicare Other

## 2016-12-29 ENCOUNTER — Encounter: Payer: Self-pay | Admitting: Podiatry

## 2016-12-29 ENCOUNTER — Ambulatory Visit (INDEPENDENT_AMBULATORY_CARE_PROVIDER_SITE_OTHER): Payer: Medicare Other | Admitting: Podiatry

## 2016-12-29 ENCOUNTER — Telehealth: Payer: Self-pay | Admitting: Physician Assistant

## 2016-12-29 VITALS — BP 152/89 | HR 70 | Resp 16 | Ht 75.0 in | Wt 196.0 lb

## 2016-12-29 DIAGNOSIS — M779 Enthesopathy, unspecified: Secondary | ICD-10-CM

## 2016-12-29 DIAGNOSIS — L84 Corns and callosities: Secondary | ICD-10-CM | POA: Diagnosis not present

## 2016-12-29 DIAGNOSIS — F4323 Adjustment disorder with mixed anxiety and depressed mood: Secondary | ICD-10-CM

## 2016-12-29 DIAGNOSIS — M79672 Pain in left foot: Secondary | ICD-10-CM

## 2016-12-29 DIAGNOSIS — M79671 Pain in right foot: Secondary | ICD-10-CM

## 2016-12-29 MED ORDER — TRIAMCINOLONE ACETONIDE 10 MG/ML IJ SUSP
10.0000 mg | Freq: Once | INTRAMUSCULAR | Status: AC
  Administered 2016-12-29: 10 mg

## 2016-12-29 NOTE — Progress Notes (Signed)
   Subjective:    Patient ID: Jerry Governhomas E Aime Jr., male    DOB: 12-Jan-1951, 66 y.o.   MRN: [id number]  HPI Chief Complaint  Patient presents with  . Painful lesion    Bilateral; plantar forefoot-below 5th toe  . Foot Pain    Bilateral; plantar; pt stated, "Feet have hurt since last Fall 2017"; Left foot-dorsal - swelling      Review of Systems  Psychiatric/Behavioral: The patient is nervous/anxious.   All other systems reviewed and are negative.      Objective:   Physical Exam        Assessment & Plan:

## 2016-12-29 NOTE — Telephone Encounter (Signed)
Pt is checking on status of a refill request for klonapin  Best number [phone]

## 2016-12-30 NOTE — Progress Notes (Signed)
Subjective:    Patient ID: Jerry Spears., male   DOB: 66 y.o.   MRN: [id number]   HPI patient states that he's had a lot of pain underneath the fifth metatarsals of both feet and that his left foot is had some moderate swelling for the last month or to be does not remember specific injury. States these areas make him walk differently    Review of Systems  All other systems reviewed and are negative.       Objective:  Physical Exam  Cardiovascular: Intact distal pulses.   Musculoskeletal: Normal range of motion.  Neurological: He is alert.  Skin: Skin is warm.  Nursing note and vitals reviewed.  neurovascular status intact muscle strength adequate range of motion within normal limits with patient found to have severe keratotic lesion sub-fifth metatarsal bilateral with high arch foot type and fluid buildup noted. It is mild swelling dorsal left foot but no area that had extreme discomfort     Assessment:    Cavus like foot type with a lot of pressure against fifth metatarsal bilateral with fluid formation consistent with capsulitis and keratotic lesion formation and probable compensatory swelling of the left dorsal foot     Plan:    H&P conditions reviewed and careful injections administered fifth MPJ bilateral to milligrams Dexon some Kenalog 5 mill grams Xylocaine and deep debridement of lesions accomplished. I then went ahead and discussed possible orthotics and reviewed x-rays and he'll be seen back when symptomatic again  Tray report indicated that there is pressure against the fifth metatarsals with cavus foot type and no other pathology at this time

## 2016-12-31 MED ORDER — CLONAZEPAM 1 MG PO TABS: ORAL_TABLET | ORAL | 0 refills | Status: DC

## 2016-12-31 NOTE — Telephone Encounter (Signed)
We can refill for 1 month Tuesday during our opening hours.  You can advise.  Will fax.

## 2017-01-02 NOTE — Telephone Encounter (Signed)
[facility] 

## 2017-01-08 ENCOUNTER — Other Ambulatory Visit: Payer: Self-pay | Admitting: Physician Assistant

## 2017-01-08 NOTE — Telephone Encounter (Signed)
12/11/16 last ov and refill 

## 2017-01-09 NOTE — Telephone Encounter (Signed)
Please ask he is requesting this refill.  It looks like he was not taking this because it made him sleepy.  Per note. if

## 2017-01-10 ENCOUNTER — Other Ambulatory Visit: Payer: Self-pay | Admitting: Physician Assistant

## 2017-01-10 DIAGNOSIS — M79671 Pain in right foot: Secondary | ICD-10-CM

## 2017-01-10 DIAGNOSIS — M79672 Pain in left foot: Principal | ICD-10-CM

## 2017-01-12 NOTE — Telephone Encounter (Signed)
12/14/16 last ov and refill

## 2017-02-01 NOTE — Telephone Encounter (Signed)
Patient will need an office visit for additional refills of meloxicam.

## 2017-02-02 ENCOUNTER — Other Ambulatory Visit: Payer: Self-pay | Admitting: Physician Assistant

## 2017-02-02 DIAGNOSIS — F4323 Adjustment disorder with mixed anxiety and depressed mood: Secondary | ICD-10-CM

## 2017-02-02 NOTE — Telephone Encounter (Signed)
Please advise 

## 2017-02-15 ENCOUNTER — Other Ambulatory Visit: Payer: Self-pay | Admitting: Physician Assistant

## 2017-02-22 ENCOUNTER — Other Ambulatory Visit: Payer: Self-pay | Admitting: Physician Assistant

## 2017-03-09 ENCOUNTER — Other Ambulatory Visit: Payer: Self-pay | Admitting: Physician Assistant

## 2017-03-10 ENCOUNTER — Other Ambulatory Visit: Payer: Self-pay | Admitting: Physician Assistant

## 2017-03-13 NOTE — Telephone Encounter (Signed)
SPOKE WITH PT ADVISED HIM HE HAS TO MAKE AND OV FOR ADDITIONAL REFILL ON GABAPENTIN PT WILL CALL US BACK AT END OF MONTH

## 2017-03-14 ENCOUNTER — Other Ambulatory Visit: Payer: Self-pay | Admitting: Physician Assistant

## 2017-03-15 ENCOUNTER — Telehealth: Payer: Self-pay | Admitting: Family Medicine

## 2017-03-15 NOTE — Telephone Encounter (Signed)
lmom for pt to set an OV with English for refill on Clonidine

## 2017-03-15 NOTE — Telephone Encounter (Signed)
lmom for pt to set an OV with English for refill on Clonidine 

## 2017-04-11 ENCOUNTER — Ambulatory Visit (INDEPENDENT_AMBULATORY_CARE_PROVIDER_SITE_OTHER): Payer: Medicare Other | Admitting: Physician Assistant

## 2017-04-11 ENCOUNTER — Encounter: Payer: Self-pay | Admitting: Physician Assistant

## 2017-04-11 VITALS — BP 143/76 | HR 59 | Temp 98.4°F | Resp 18 | Ht 74.88 in | Wt 208.6 lb

## 2017-04-11 DIAGNOSIS — G8929 Other chronic pain: Secondary | ICD-10-CM

## 2017-04-11 DIAGNOSIS — I10 Essential (primary) hypertension: Secondary | ICD-10-CM

## 2017-04-11 DIAGNOSIS — F4323 Adjustment disorder with mixed anxiety and depressed mood: Secondary | ICD-10-CM

## 2017-04-11 DIAGNOSIS — M549 Dorsalgia, unspecified: Secondary | ICD-10-CM | POA: Diagnosis not present

## 2017-04-11 LAB — CMP14+EGFR
ALBUMIN: 4.1 g/dL (ref 3.6–4.8)
ALT: 17 IU/L (ref 0–44)
AST: 27 IU/L (ref 0–40)
Albumin/Globulin Ratio: 1.4 (ref 1.2–2.2)
Alkaline Phosphatase: 85 IU/L (ref 39–117)
BUN / CREAT RATIO: 3 — AB (ref 10–24)
BUN: 4 mg/dL — AB (ref 8–27)
Bilirubin Total: 0.4 mg/dL (ref 0.0–1.2)
CALCIUM: 9 mg/dL (ref 8.6–10.2)
CO2: 24 mmol/L (ref 20–29)
CREATININE: 1.22 mg/dL (ref 0.76–1.27)
Chloride: 101 mmol/L (ref 96–106)
GFR calc non Af Amer: 61 mL/min/{1.73_m2} (ref >59)
GFR, EST AFRICAN AMERICAN: 71 mL/min/{1.73_m2} (ref >59)
GLUCOSE: 101 mg/dL — AB (ref 65–99)
Globulin, Total: 3 g/dL (ref 1.5–4.5)
Potassium: 4.8 mmol/L (ref 3.5–5.2)
Sodium: 138 mmol/L (ref 134–144)
TOTAL PROTEIN: 7.1 g/dL (ref 6.0–8.5)

## 2017-04-11 LAB — CBC
HEMATOCRIT: 41.1 % (ref 37.5–51.0)
Hemoglobin: 14.1 g/dL (ref 13.0–17.7)
MCH: 34.6 pg — ABNORMAL HIGH (ref 26.6–33.0)
MCHC: 34.3 g/dL (ref 31.5–35.7)
MCV: 101 fL — AB (ref 79–97)
Platelets: 235 10*3/uL (ref 150–379)
RBC: 4.08 x10E6/uL — ABNORMAL LOW (ref 4.14–5.80)
RDW: 13.9 % (ref 12.3–15.4)
WBC: 5.5 10*3/uL (ref 3.4–10.8)

## 2017-04-11 MED ORDER — CLONIDINE HCL 0.1 MG PO TABS: ORAL_TABLET | ORAL | 1 refills | Status: DC

## 2017-04-11 MED ORDER — GABAPENTIN 300 MG PO CAPS: ORAL_CAPSULE | ORAL | 5 refills | Status: DC

## 2017-04-11 MED ORDER — CLONAZEPAM 1 MG PO TABS: ORAL_TABLET | ORAL | 2 refills | Status: DC

## 2017-04-11 NOTE — Progress Notes (Signed)
PRIMARY CARE AT [place], [place] [address] [phone]  Date:  04/11/2017   Name:  Jerry Spears.   DOB:  09-27-1950   MRN:  [id number]  PCP:  Joretta Bachelor, PA    History of Present Illness:  Jerry Spears. is a 66 y.o. male patient who presents to PCP with  Chief Complaint  Patient presents with  . Medication Refill    Clonazepam, clonidine, gabapentin     BACK PAIN: Gabapentin has been helpful for him.  This helps with his chronic back pain.    HTN: no chest pains.  He has some insomnia.  He takes sleep aid.  The clonazepam will help with his sleep.  He takes half tablet before bed.   He has been without the clonidine   ANXIETY: "too much time to think".  He is currently contemplating part-time work.  He is not drinking any etOH at this time.     There are no active problems to display for this patient.   Past Medical History:  Diagnosis Date  . Anxiety   . Chronic back pain   . DDD (degenerative disc disease), lumbar     History reviewed. No pertinent surgical history.  Social History  Substance Use Topics  . Smoking status: Current Every Day Smoker    Packs/day: 1.00    Types: Cigarettes  . Smokeless tobacco: Current User  . Alcohol use Yes     Comment: occ    History reviewed. No pertinent family history.  No Known Allergies  Medication list has been reviewed and updated.  Current Outpatient Prescriptions on File Prior to Visit  Medication Sig Dispense Refill  . albuterol (PROVENTIL HFA;VENTOLIN HFA) 108 (90 Base) MCG/ACT inhaler Inhale 2 puffs into the lungs every 4 (four) hours as needed for wheezing or shortness of breath (cough, shortness of breath or wheezing.). 1 Inhaler 1  . aspirin 325 MG tablet Take 325 mg by mouth every 6 (six) hours as needed. For pain.    . clonazePAM (KLONOPIN) 1 MG tablet TAKE 1 TABLET BY MOUTH EVERY DAY AS NEEDED FOR ANXIETY 30 tablet 0  . cloNIDine (CATAPRES) 0.1 MG tablet TAKE 1 TABLET  (0.1 MG TOTAL) BY MOUTH 2 (TWO) TIMES DAILY. 180 tablet 0  . gabapentin (NEURONTIN) 300 MG capsule TAKE ONE CAPSULE BY MOUTH 3 TIMES A DAY AT BEDTIME 15 capsule 0  . ibuprofen (ADVIL,MOTRIN) 200 MG tablet Take 400 mg by mouth every 6 (six) hours as needed for headache (headache).    . meloxicam (MOBIC) 15 MG tablet Take 1 tablet (15 mg total) by mouth daily. Office visit needed for additional refills. 1st notice. 30 tablet 0  . oxyCODONE-acetaminophen (PERCOCET) 10-325 MG per tablet Take 1 tablet by mouth every 4 (four) hours as needed. For pain.     No current facility-administered medications on file prior to visit.     ROS ROS otherwise unremarkable unless listed above.  Physical Examination: BP (!) 143/76 (BP Location: Left Arm, Patient Position: Sitting, Cuff Size: Large)   Pulse (!) 59   Temp 98.4 F (36.9 C) (Oral)   Resp 18   Ht 6' 2.88" (1.902 m)   Wt 208 lb 9.6 oz (94.6 kg)   SpO2 97%   BMI 26.16 kg/m  Ideal Body Weight: Weight in (lb) to have BMI = 25: 199  Physical Exam  Constitutional: He is oriented to person, place, and time. He appears well-developed and well-nourished.  No distress.  HENT:  Head: Normocephalic and atraumatic.  Eyes: Pupils are equal, round, and reactive to light. Conjunctivae and EOM are normal.  Cardiovascular: Normal rate, regular rhythm, normal heart sounds and intact distal pulses.  Exam reveals no friction rub.   No murmur heard. Pulmonary/Chest: Effort normal. No respiratory distress. He has no wheezes.  Musculoskeletal: Normal range of motion. He exhibits no edema.  Neurological: He is alert and oriented to person, place, and time.  Skin: Skin is warm and dry. He is not diaphoretic.  Psychiatric: He has a normal mood and affect. His behavior is normal.     Assessment and Plan: Jerry Spears. is a 66 y.o. male who is here today for cc of medication refill for htn, chronic back pain, and anxiety/depression, insomnia--and their  follow up All are stable.  Patient takes very little of the klonopin and we can monitor this.  Refilling for 3 months total Refilled klonodine.   Essential hypertension - Plan: CMP14+EGFR, CBC, cloNIDine (CATAPRES) 0.1 MG tablet  Adjustment disorder with mixed anxiety and depressed mood - Plan: clonazePAM (KLONOPIN) 1 MG tablet  Chronic back pain, unspecified back location, unspecified back pain laterality - Plan: gabapentin (NEURONTIN) 300 MG capsule  Jerry Drape, PA-C [facility] 9/5/20189:19 AM

## 2017-04-11 NOTE — Patient Instructions (Addendum)
IF you received an x-ray today, you will receive an invoice from [facility]. Please contact [facility] at [phone] with questions or concerns regarding your invoice.   IF you received labwork today, you will receive an invoice from [facility]. Please contact [facility] at [phone] with questions or concerns regarding your invoice.   Our billing staff will not be able to assist you with questions regarding bills from these companies.  You will be contacted with the lab results as soon as they are available. The fastest way to get your results is to activate your My Chart account. Instructions are located on the last page of this paperwork. If you have not heard from us regarding the results in 2 weeks, please contact this office.    Dash  DASH Eating Plan DASH stands for "Dietary Approaches to Stop Hypertension." The DASH eating plan is a healthy eating plan that has been shown to reduce high blood pressure (hypertension). It may also reduce your risk for type 2 diabetes, heart disease, and stroke. The DASH eating plan may also help with weight loss. What are tips for following this plan? General guidelines  Avoid eating more than 2,300 mg (milligrams) of salt (sodium) a day. If you have hypertension, you may need to reduce your sodium intake to 1,500 mg a day.  Limit alcohol intake to no more than 1 drink a day for nonpregnant women and 2 drinks a day for men. One drink equals 12 oz of beer, 5 oz of wine, or 1 oz of hard liquor.  Work with your health care provider to maintain a healthy body weight or to lose weight. Ask what an ideal weight is for you.  Get at least 30 minutes of exercise that causes your heart to beat faster (aerobic exercise) most days of the week. Activities may include walking, swimming, or biking.  Work with your health care provider or diet and nutrition specialist (dietitian) to adjust your eating plan to your individual calorie  needs. Reading food labels  Check food labels for the amount of sodium per serving. Choose foods with less than 5 percent of the Daily Value of sodium. Generally, foods with less than 300 mg of sodium per serving fit into this eating plan.  To find whole grains, look for the word "whole" as the first word in the ingredient list. Shopping  Buy products labeled as "low-sodium" or "no salt added."  Buy fresh foods. Avoid canned foods and premade or frozen meals. Cooking  Avoid adding salt when cooking. Use salt-free seasonings or herbs instead of table salt or sea salt. Check with your health care provider or pharmacist before using salt substitutes.  Do not fry foods. Cook foods using healthy methods such as baking, boiling, grilling, and broiling instead.  Cook with heart-healthy oils, such as olive, canola, soybean, or sunflower oil. Meal planning   Eat a balanced diet that includes: ? 5 or more servings of fruits and vegetables each day. At each meal, try to fill half of your plate with fruits and vegetables. ? Up to 6-8 servings of whole grains each day. ? Less than 6 oz of lean meat, poultry, or fish each day. A 3-oz serving of meat is about the same size as a deck of cards. One egg equals 1 oz. ? 2 servings of low-fat dairy each day. ? A serving of nuts, seeds, or beans 5 times each week. ? Heart-healthy fats. Healthy fats called Omega-3 fatty acids are  found in foods such as flaxseeds and coldwater fish, like sardines, salmon, and mackerel.  Limit how much you eat of the following: ? Canned or prepackaged foods. ? Food that is high in trans fat, such as fried foods. ? Food that is high in saturated fat, such as fatty meat. ? Sweets, desserts, sugary drinks, and other foods with added sugar. ? Full-fat dairy products.  Do not salt foods before eating.  Try to eat at least 2 vegetarian meals each week.  Eat more home-cooked food and less restaurant, buffet, and fast  food.  When eating at a restaurant, ask that your food be prepared with less salt or no salt, if possible. What foods are recommended? The items listed may not be a complete list. Talk with your dietitian about what dietary choices are best for you. Grains Whole-grain or whole-wheat bread. Whole-grain or whole-wheat pasta. Brown rice. Oatmeal. Quinoa. Bulgur. Whole-grain and low-sodium cereals. Pita bread. Low-fat, low-sodium crackers. Whole-wheat flour tortillas. Vegetables Fresh or frozen vegetables (raw, steamed, roasted, or grilled). Low-sodium or reduced-sodium tomato and vegetable juice. Low-sodium or reduced-sodium tomato sauce and tomato paste. Low-sodium or reduced-sodium canned vegetables. Fruits All fresh, dried, or frozen fruit. Canned fruit in natural juice (without added sugar). Meat and other protein foods Skinless chicken or turkey. Ground chicken or turkey. Pork with fat trimmed off. Fish and seafood. Egg whites. Dried beans, peas, or lentils. Unsalted nuts, nut butters, and seeds. Unsalted canned beans. Lean cuts of beef with fat trimmed off. Low-sodium, lean deli meat. Dairy Low-fat (1%) or fat-free (skim) milk. Fat-free, low-fat, or reduced-fat cheeses. Nonfat, low-sodium ricotta or cottage cheese. Low-fat or nonfat yogurt. Low-fat, low-sodium cheese. Fats and oils Soft margarine without trans fats. Vegetable oil. Low-fat, reduced-fat, or light mayonnaise and salad dressings (reduced-sodium). Canola, safflower, olive, soybean, and sunflower oils. Avocado. Seasoning and other foods Herbs. Spices. Seasoning mixes without salt. Unsalted popcorn and pretzels. Fat-free sweets. What foods are not recommended? The items listed may not be a complete list. Talk with your dietitian about what dietary choices are best for you. Grains Baked goods made with fat, such as croissants, muffins, or some breads. Dry pasta or rice meal packs. Vegetables Creamed or fried vegetables. Vegetables  in a cheese sauce. Regular canned vegetables (not low-sodium or reduced-sodium). Regular canned tomato sauce and paste (not low-sodium or reduced-sodium). Regular tomato and vegetable juice (not low-sodium or reduced-sodium). Pickles. Olives. Fruits Canned fruit in a light or heavy syrup. Fried fruit. Fruit in cream or butter sauce. Meat and other protein foods Fatty cuts of meat. Ribs. Fried meat. Bacon. Sausage. Bologna and other processed lunch meats. Salami. Fatback. Hotdogs. Bratwurst. Salted nuts and seeds. Canned beans with added salt. Canned or smoked fish. Whole eggs or egg yolks. Chicken or turkey with skin. Dairy Whole or 2% milk, cream, and half-and-half. Whole or full-fat cream cheese. Whole-fat or sweetened yogurt. Full-fat cheese. Nondairy creamers. Whipped toppings. Processed cheese and cheese spreads. Fats and oils Butter. Stick margarine. Lard. Shortening. Ghee. Bacon fat. Tropical oils, such as coconut, palm kernel, or palm oil. Seasoning and other foods Salted popcorn and pretzels. Onion salt, garlic salt, seasoned salt, table salt, and sea salt. Worcestershire sauce. Tartar sauce. Barbecue sauce. Teriyaki sauce. Soy sauce, including reduced-sodium. Steak sauce. Canned and packaged gravies. Fish sauce. Oyster sauce. Cocktail sauce. Horseradish that you find on the shelf. Ketchup. Mustard. Meat flavorings and tenderizers. Bouillon cubes. Hot sauce and Tabasco sauce. Premade or packaged marinades. Premade or packaged taco seasonings.   and tenderizers. Bouillon cubes. Hot sauce and Tabasco sauce. Premade or packaged marinades. Premade or packaged taco seasonings. Relishes. Regular salad dressings. Where to find more information:  National Heart, Lung, and Blood Institute: www.nhlbi.nih.gov  American Heart Association: www.heart.org Summary  The DASH eating plan is a healthy eating plan that has been shown to reduce high blood pressure (hypertension). It may also reduce your risk for type 2 diabetes, heart disease, and stroke.  With the DASH eating plan, you should limit salt (sodium) intake  to 2,300 mg a day. If you have hypertension, you may need to reduce your sodium intake to 1,500 mg a day.  When on the DASH eating plan, aim to eat more fresh fruits and vegetables, whole grains, lean proteins, low-fat dairy, and heart-healthy fats.  Work with your health care provider or diet and nutrition specialist (dietitian) to adjust your eating plan to your individual calorie needs. This information is not intended to replace advice given to you by your health care provider. Make sure you discuss any questions you have with your health care provider. Document Released: 07/13/2011 Document Revised: 07/17/2016 Document Reviewed: 07/17/2016 Elsevier Interactive Patient Education  2017 Elsevier Inc.  

## 2017-06-06 ENCOUNTER — Other Ambulatory Visit: Payer: Self-pay | Admitting: Physician Assistant

## 2017-06-06 DIAGNOSIS — J22 Unspecified acute lower respiratory infection: Secondary | ICD-10-CM

## 2017-07-05 ENCOUNTER — Other Ambulatory Visit: Payer: Self-pay | Admitting: Physician Assistant

## 2017-07-05 DIAGNOSIS — F4323 Adjustment disorder with mixed anxiety and depressed mood: Secondary | ICD-10-CM

## 2017-07-06 ENCOUNTER — Other Ambulatory Visit: Payer: Self-pay | Admitting: Physician Assistant

## 2017-07-06 DIAGNOSIS — J22 Unspecified acute lower respiratory infection: Secondary | ICD-10-CM

## 2017-07-12 ENCOUNTER — Telehealth: Payer: Self-pay | Admitting: Physician Assistant

## 2017-07-12 DIAGNOSIS — F4323 Adjustment disorder with mixed anxiety and depressed mood: Secondary | ICD-10-CM

## 2017-07-12 NOTE — Telephone Encounter (Signed)
Copied from CRM [phone]#[id number]. Topic: Quick Communication - See Telephone Encounter >> Jul 12, 2017  2:30 PM Waymon AmatoBurton, Donna F wrote: CRM for notification. See Telephone encounter for: pt is needing a refill on clonazepam [facility] [address]  Best number [phone] 07/12/17.

## 2017-07-13 NOTE — Telephone Encounter (Signed)
Please advise 

## 2017-07-13 NOTE — Telephone Encounter (Signed)
Pt  Requesting a  Refill of  Clonazepam

## 2017-07-16 NOTE — Telephone Encounter (Signed)
Pt calling about refill on medication, please call back to give update, thanks

## 2017-07-18 MED ORDER — CLONAZEPAM 1 MG PO TABS: ORAL_TABLET | ORAL | 0 refills | Status: DC

## 2017-07-18 NOTE — Telephone Encounter (Signed)
Please fax or call in to pharmacy refill prescription.  This is in my box at tl desk

## 2017-07-18 NOTE — Addendum Note (Signed)
Addended by: Trena PlattENGLISH, STEPHANIE D on: 07/18/2017 10:08 AM   Modules accepted: Orders

## 2017-07-19 NOTE — Telephone Encounter (Signed)
Phone call to pharmacy. Spoke with PharmD, Michele McalpinePhil. Clonazepam 1mg  called in to pharmacy.   Phone call to patient. Patient notified prescription has been called in, has no further questions. Closing note.

## 2017-09-24 ENCOUNTER — Other Ambulatory Visit: Payer: Self-pay | Admitting: Physician Assistant

## 2017-09-24 DIAGNOSIS — M549 Dorsalgia, unspecified: Principal | ICD-10-CM

## 2017-09-24 DIAGNOSIS — G8929 Other chronic pain: Secondary | ICD-10-CM

## 2017-09-27 ENCOUNTER — Other Ambulatory Visit: Payer: Self-pay | Admitting: Physician Assistant

## 2017-09-27 DIAGNOSIS — F4323 Adjustment disorder with mixed anxiety and depressed mood: Secondary | ICD-10-CM

## 2017-09-27 NOTE — Telephone Encounter (Signed)
Refill of klonopin  LOV    04/11/17  LRF 07/18/17  #30  0 refills  [id number] - [place], [place] - [address]

## 2017-10-02 ENCOUNTER — Other Ambulatory Visit: Payer: Self-pay | Admitting: Physician Assistant

## 2017-10-02 DIAGNOSIS — I10 Essential (primary) hypertension: Secondary | ICD-10-CM

## 2017-10-31 ENCOUNTER — Other Ambulatory Visit: Payer: Self-pay | Admitting: Physician Assistant

## 2017-10-31 DIAGNOSIS — I10 Essential (primary) hypertension: Secondary | ICD-10-CM

## 2017-10-31 DIAGNOSIS — J22 Unspecified acute lower respiratory infection: Secondary | ICD-10-CM

## 2017-11-07 ENCOUNTER — Encounter: Payer: Self-pay | Admitting: Physician Assistant

## 2017-11-08 ENCOUNTER — Emergency Department (HOSPITAL_COMMUNITY)
Admission: EM | Admit: 2017-11-08 | Discharge: 2017-11-08 | Disposition: A | Discharge status: Home / Self Care | Payer: Medicare Other | Source: Home / Self Care | Attending: Emergency Medicine | Admitting: Emergency Medicine

## 2017-11-08 ENCOUNTER — Encounter (HOSPITAL_COMMUNITY): Payer: Self-pay | Admitting: *Deleted

## 2017-11-08 ENCOUNTER — Other Ambulatory Visit: Payer: Self-pay

## 2017-11-08 ENCOUNTER — Emergency Department (HOSPITAL_COMMUNITY)
Admission: EM | Admit: 2017-11-08 | Discharge: 2017-11-08 | Disposition: A | Discharge status: Home / Self Care | Payer: Medicare Other | Attending: Emergency Medicine | Admitting: Emergency Medicine

## 2017-11-08 ENCOUNTER — Emergency Department (HOSPITAL_COMMUNITY): Payer: Medicare Other

## 2017-11-08 ENCOUNTER — Encounter (HOSPITAL_COMMUNITY): Payer: Self-pay | Admitting: Emergency Medicine

## 2017-11-08 DIAGNOSIS — J22 Unspecified acute lower respiratory infection: Secondary | ICD-10-CM

## 2017-11-08 DIAGNOSIS — Z79899 Other long term (current) drug therapy: Secondary | ICD-10-CM | POA: Insufficient documentation

## 2017-11-08 DIAGNOSIS — M549 Dorsalgia, unspecified: Secondary | ICD-10-CM | POA: Insufficient documentation

## 2017-11-08 DIAGNOSIS — F419 Anxiety disorder, unspecified: Secondary | ICD-10-CM | POA: Insufficient documentation

## 2017-11-08 DIAGNOSIS — Z720 Tobacco use: Secondary | ICD-10-CM

## 2017-11-08 DIAGNOSIS — F17210 Nicotine dependence, cigarettes, uncomplicated: Secondary | ICD-10-CM | POA: Insufficient documentation

## 2017-11-08 DIAGNOSIS — F1110 Opioid abuse, uncomplicated: Secondary | ICD-10-CM | POA: Insufficient documentation

## 2017-11-08 DIAGNOSIS — J209 Acute bronchitis, unspecified: Secondary | ICD-10-CM | POA: Insufficient documentation

## 2017-11-08 DIAGNOSIS — I4510 Unspecified right bundle-branch block: Secondary | ICD-10-CM

## 2017-11-08 DIAGNOSIS — G8929 Other chronic pain: Secondary | ICD-10-CM

## 2017-11-08 DIAGNOSIS — R05 Cough: Secondary | ICD-10-CM | POA: Diagnosis not present

## 2017-11-08 LAB — COMPREHENSIVE METABOLIC PANEL
ALT: 15 U/L — AB (ref 17–63)
AST: 24 U/L (ref 15–41)
Albumin: 4 g/dL (ref 3.5–5.0)
Alkaline Phosphatase: 82 U/L (ref 38–126)
Anion gap: 8 (ref 5–15)
BUN: 16 mg/dL (ref 6–20)
CHLORIDE: 105 mmol/L (ref 101–111)
CO2: 28 mmol/L (ref 22–32)
CREATININE: 1.16 mg/dL (ref 0.61–1.24)
Calcium: 9.1 mg/dL (ref 8.9–10.3)
GFR calc non Af Amer: >60 mL/min (ref >60)
Glucose, Bld: 97 mg/dL (ref 65–99)
Potassium: 4.3 mmol/L (ref 3.5–5.1)
SODIUM: 141 mmol/L (ref 135–145)
Total Bilirubin: 0.8 mg/dL (ref 0.3–1.2)
Total Protein: 8.3 g/dL — ABNORMAL HIGH (ref 6.5–8.1)

## 2017-11-08 LAB — CBC WITH DIFFERENTIAL/PLATELET
Basophils Absolute: 0 10*3/uL (ref 0.0–0.1)
Basophils Relative: 0 %
EOS PCT: 1 %
Eosinophils Absolute: 0 10*3/uL (ref 0.0–0.7)
HEMATOCRIT: 41.4 % (ref 39.0–52.0)
HEMOGLOBIN: 14 g/dL (ref 13.0–17.0)
LYMPHS ABS: 1.5 10*3/uL (ref 0.7–4.0)
LYMPHS PCT: 25 %
MCH: 32.8 pg (ref 26.0–34.0)
MCHC: 33.8 g/dL (ref 30.0–36.0)
MCV: 97 fL (ref 78.0–100.0)
Monocytes Absolute: 0.4 10*3/uL (ref 0.1–1.0)
Monocytes Relative: 6 %
NEUTROS ABS: 4.2 10*3/uL (ref 1.7–7.7)
NEUTROS PCT: 68 %
Platelets: 242 10*3/uL (ref 150–400)
RBC: 4.27 MIL/uL (ref 4.22–5.81)
RDW: 13.2 % (ref 11.5–15.5)
WBC: 6.1 10*3/uL (ref 4.0–10.5)

## 2017-11-08 LAB — ETHANOL: Alcohol, Ethyl (B): <10 mg/dL (ref <10)

## 2017-11-08 LAB — TROPONIN I: Troponin I: <0.03 ng/mL (ref <0.03)

## 2017-11-08 MED ORDER — NAPROXEN 500 MG PO TABS: 500.0000 mg | ORAL_TABLET | Freq: Two times a day (BID) | ORAL | Status: DC | PRN

## 2017-11-08 MED ORDER — HYDROXYZINE HCL 25 MG PO TABS: 25.0000 mg | ORAL_TABLET | Freq: Four times a day (QID) | ORAL | Status: DC | PRN

## 2017-11-08 MED ORDER — ALBUTEROL SULFATE HFA 108 (90 BASE) MCG/ACT IN AERS: INHALATION_SPRAY | RESPIRATORY_TRACT | 1 refills | Status: DC

## 2017-11-08 MED ORDER — LOPERAMIDE HCL 2 MG PO CAPS: 2.0000 mg | ORAL_CAPSULE | ORAL | Status: DC | PRN

## 2017-11-08 MED ORDER — CLONIDINE HCL 0.1 MG PO TABS: 0.1000 mg | ORAL_TABLET | Freq: Every day | ORAL | Status: DC

## 2017-11-08 MED ORDER — HYDROXYZINE HCL 25 MG PO TABS: 25.0000 mg | ORAL_TABLET | Freq: Four times a day (QID) | ORAL | 0 refills | Status: DC

## 2017-11-08 MED ORDER — CLONIDINE HCL 0.1 MG PO TABS
0.1000 mg | ORAL_TABLET | Freq: Four times a day (QID) | ORAL | Status: DC
  Administered 2017-11-08: 0.1 mg via ORAL
  Filled 2017-11-08: qty 1

## 2017-11-08 MED ORDER — METHOCARBAMOL 500 MG PO TABS: 500.0000 mg | ORAL_TABLET | Freq: Three times a day (TID) | ORAL | Status: DC | PRN

## 2017-11-08 MED ORDER — CLONIDINE HCL 0.1 MG PO TABS: 0.1000 mg | ORAL_TABLET | ORAL | Status: DC

## 2017-11-08 MED ORDER — DICYCLOMINE HCL 20 MG PO TABS
20.0000 mg | ORAL_TABLET | Freq: Four times a day (QID) | ORAL | Status: DC | PRN
Start: 2017-11-08 — End: 2017-11-08

## 2017-11-08 MED ORDER — METHOCARBAMOL 500 MG PO TABS: 500.0000 mg | ORAL_TABLET | Freq: Two times a day (BID) | ORAL | 0 refills | Status: DC

## 2017-11-08 MED ORDER — ONDANSETRON 4 MG PO TBDP: 4.0000 mg | ORAL_TABLET | Freq: Four times a day (QID) | ORAL | Status: DC | PRN

## 2017-11-08 NOTE — ED Triage Notes (Signed)
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 Pt was seen earlier this morning and states he did not feel comfortable telling anyone. Pt states he went home after being discharged and did more heroin.

## 2017-11-08 NOTE — Discharge Instructions (Addendum)
Return if you start running a fever or are getting more short of breath.

## 2017-11-08 NOTE — ED Provider Notes (Signed)
[facility]-EMERGENCY DEPT Provider Note   CSN: [id number] Arrival date & time: 11/08/17  0408     History   Chief Complaint Chief Complaint  Patient presents with  . Cough    HPI Jerry Governhomas E Rapozo Jr. is a 67 y.o. male.  The history is provided by the patient.  Cough   He is a cigarette smoker and has had chest congestion for the last 2 weeks.  He took a Z-Pak which had been left over, but did not seem to give him any improvement.  Cough has been productive of yellowish sputum.  He denies dyspnea and denies any chest pain.  He denies arthralgias or myalgias.  For the last 2 nights, he has woken up with his pillow wet and had some chills.  He has never taken his temperature, but he thinks he was probably running a fever.  He is concerned that he has developed pneumonia.  He denies any sick contacts.  Past Medical History:  Diagnosis Date  . Anxiety   . Chronic back pain   . DDD (degenerative disc disease), lumbar     There are no active problems to display for this patient.   History reviewed. No pertinent surgical history.      Home Medications    Prior to Admission medications   Medication Sig Start Date End Date Taking? Authorizing Provider  albuterol (PROVENTIL HFA;VENTOLIN HFA) 108 (90 Base) MCG/ACT inhaler INHALE 2 PUFFS EVERY 4 HOURS AS NEEDED FOR WHEEZING & SHORTNESS OF BREATH 07/06/17   Trena PlattEnglish, Stephanie D, PA  aspirin 325 MG tablet Take 325 mg by mouth every 6 (six) hours as needed. For pain.    [provider]  clonazePAM (KLONOPIN) 1 MG tablet TAKE 1 TABLET BY MOUTH EVERY DAY AS NEEDED FOR ANXIETY 07/18/17   English, Judeth CornfieldStephanie D, PA  cloNIDine (CATAPRES) 0.1 MG tablet TAKE 1 TABLET (0.1 MG TOTAL) BY MOUTH 2 (TWO) TIMES DAILY, NEED OFFICE VISIT 10/31/17   Trena PlattEnglish, Stephanie D, PA  gabapentin (NEURONTIN) 300 MG capsule TAKE ONE CAPSULE BY MOUTH 3 TIMES A DAY AT BEDTIME 09/24/17   English, Judeth CornfieldStephanie D, PA  ibuprofen (ADVIL,MOTRIN) 200  MG tablet Take 400 mg by mouth every 6 (six) hours as needed for headache (headache).    [provider]  meloxicam (MOBIC) 15 MG tablet Take 1 tablet (15 mg total) by mouth daily. Office visit needed for additional refills. 1st notice. 02/01/17   Wallis BambergMani, Mario, PA-C  oxyCODONE-acetaminophen (PERCOCET) 10-325 MG per tablet Take 1 tablet by mouth every 4 (four) hours as needed. For pain.    [provider]    Family History No family history on file.  Social History Social History   Tobacco Use  . Smoking status: Current Every Day Smoker    Packs/day: 1.00    Types: Cigarettes  . Smokeless tobacco: Current User  Substance Use Topics  . Alcohol use: Yes    Comment: occ  . Drug use: No     Allergies   Patient has no known allergies.   Review of Systems Review of Systems  Respiratory: Positive for cough.   All other systems reviewed and are negative.    Physical Exam Updated Vital Signs BP (!) 181/96 (BP Location: Right Arm)   Pulse 61   Temp 97.9 F (36.6 C) (Oral)   SpO2 98%   Physical Exam  Nursing note and vitals reviewed.  67 year old male, resting comfortably and in no acute distress.  Vital signs are significant for elevated blood pressure. Oxygen saturation is 98%, which is normal. Head is normocephalic and atraumatic. PERRLA, EOMI. Oropharynx is clear. Neck is nontender and supple without adenopathy or JVD. Back is nontender and there is no CVA tenderness. Lungs are clear without rales, wheezes, or rhonchi. Chest is nontender. Heart has regular rate and rhythm without murmur. Abdomen is soft, flat, nontender without masses or hepatosplenomegaly and peristalsis is normoactive. Extremities have no cyanosis or edema, full range of motion is present. Skin is warm and dry without rash. Neurologic: Mental status is normal, cranial nerves are intact, there are no motor or sensory deficits.  ED Treatments / Results   Radiology Dg Chest 2  View  Result Date: 11/08/2017 CLINICAL DATA:  Cough for 1 week.  Night sweats.  Smoker. EXAM: CHEST - 2 VIEW COMPARISON:  08/26/2012 FINDINGS: Emphysematous changes in the lungs. Peribronchial thickening suggesting chronic bronchitis. No airspace disease or consolidation. No blunting of costophrenic angles. No pneumothorax. Heart size and pulmonary vascularity are normal. Degenerative changes in the spine. 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 No evidence of active pulmonary disease. Electronically Signed   By: Burman Nieves M.D.   On: 11/08/2017 05:51    Procedures Procedures   Medications Ordered in ED Medications - No data to display   Initial Impression / Assessment and Plan / ED Course  I have reviewed the triage vital signs and the nursing notes.  Pertinent imaging results that were available during my care of the patient were reviewed by me and considered in my medical decision making (see chart for details).  Cough which probably is viral bronchitis.  Will get chest x-ray to rule out pneumonia.  Old records are reviewed, and he has no relevant past visits.  X-ray shows no evidence of pneumonia.  No indication for antibiotics.  Patient states that he has run out of albuterol inhaler, and that did seem to be giving him some relief.  He is given a prescription for a new inhaler.  Advised to return should he start running fever or should dyspnea seem to be worsening.  Follow-up with PCP.  Encouraged to stop smoking.  Final Clinical Impressions(s) / ED Diagnoses   Final diagnoses:  Acute bronchitis, unspecified organism    ED Discharge Orders    None       Dione Booze, MD 11/08/17 [phone]

## 2017-11-08 NOTE — ED Notes (Signed)
Bed: WLPT4 Expected date:  Expected time:  Means of arrival:  Comments: 

## 2017-11-08 NOTE — ED Notes (Signed)
Pt coming out of his room twice to inquirre about his chest x-ray results, Dr Preston FleetingGlick notified.

## 2017-11-08 NOTE — ED Provider Notes (Signed)
[facility]-EMERGENCY DEPT Provider Note   CSN: [id number] Arrival date & time: 11/08/17  1124     History   Chief Complaint Chief Complaint  Patient presents with  . Drug Problem    HPI Jerry Spears. is a 67 y.o. male.  Pt presents to the ED today with anxiety.  He was here early this morning because he was worried he had pna.  CXR obtained showed no pna and he was d/c home with an inhaler.  Pt said he has been using heroin b/c he has chronic pain.  He can no longer get narcotics from his doctor which is why he turned to heroin.  The pt went home and used heroin and was worried that he would have a seizure.  The pt has never had a seizure in the past.  The pt said he felt jittery and sweaty after he used the heroin.  He just feels anxious now.  No si/hi.     Past Medical History:  Diagnosis Date  . Anxiety   . Chronic back pain   . DDD (degenerative disc disease), lumbar     There are no active problems to display for this patient.   History reviewed. No pertinent surgical history.      Home Medications    Prior to Admission medications   Medication Sig Start Date End Date Taking? Authorizing Provider  albuterol (PROVENTIL HFA;VENTOLIN HFA) 108 (90 Base) MCG/ACT inhaler INHALE 2 PUFFS EVERY 4 HOURS AS NEEDED FOR WHEEZING & SHORTNESS OF BREATH 11/08/17  Yes Dione Booze, MD  cloNIDine (CATAPRES) 0.1 MG tablet TAKE 1 TABLET (0.1 MG TOTAL) BY MOUTH 2 (TWO) TIMES DAILY, NEED OFFICE VISIT 10/31/17  Yes English, Stephanie D, PA  gabapentin (NEURONTIN) 300 MG capsule TAKE ONE CAPSULE BY MOUTH 3 TIMES A DAY AT BEDTIME 09/24/17  Yes English, Stephanie D, PA  ibuprofen (ADVIL,MOTRIN) 200 MG tablet Take 400 mg by mouth every 6 (six) hours as needed for headache (headache).   Yes [provider]  clonazePAM (KLONOPIN) 1 MG tablet TAKE 1 TABLET BY MOUTH EVERY DAY AS NEEDED FOR ANXIETY Patient not taking: Reported on 11/08/2017 07/18/17   Trena Platt D, PA  hydrOXYzine (ATARAX/VISTARIL) 25 MG tablet Take 1 tablet (25 mg total) by mouth every 6 (six) hours. 11/08/17   Jacalyn Lefevre, MD  methocarbamol (ROBAXIN) 500 MG tablet Take 1 tablet (500 mg total) by mouth 2 (two) times daily. 11/08/17   Jacalyn Lefevre, MD    Family History No family history on file.  Social History Social History   Tobacco Use  . Smoking status: Current Every Day Smoker    Packs/day: 1.00    Types: Cigarettes  . Smokeless tobacco: Current User  Substance Use Topics  . Alcohol use: Yes    Comment: occ  . Drug use: No     Allergies   Patient has no known allergies.   Review of Systems Review of Systems  Respiratory: Positive for cough.   Psychiatric/Behavioral: The patient is nervous/anxious.   All other systems reviewed and are negative.    Physical Exam Updated Vital Signs BP (!) 178/114 (BP Location: Left Arm)   Pulse 63   Temp 98.5 F (36.9 C) (Oral)   Resp 18   SpO2 97%   Physical Exam  Constitutional: He is oriented to person, place, and time. He appears well-developed and well-nourished.  HENT:  Head: Normocephalic and atraumatic.  Right Ear: External ear normal.  Left Ear: External ear normal.  Nose: Nose normal.  Mouth/Throat: Oropharynx is clear and moist.  Eyes: Pupils are equal, round, and reactive to light. Conjunctivae and EOM are normal.  Neck: Normal range of motion. Neck supple.  Cardiovascular: Normal rate, regular rhythm, normal heart sounds and intact distal pulses.  Pulmonary/Chest: Effort normal and breath sounds normal.  Abdominal: Soft. Bowel sounds are normal.  Musculoskeletal: Normal range of motion.  Neurological: He is alert and oriented to person, place, and time.  Skin: Skin is warm. Capillary refill takes less than 2 seconds.  Psychiatric: His mood appears anxious.  Nursing note and vitals reviewed.    ED Treatments / Results  Labs (all labs ordered are listed, but only abnormal results  are displayed) Labs Reviewed  COMPREHENSIVE METABOLIC PANEL - Abnormal; Notable for the following components:      Result Value   Total Protein 8.3 (*)    ALT 15 (*)    All other components within normal limits  ETHANOL  CBC WITH DIFFERENTIAL/PLATELET  TROPONIN I  RAPID URINE DRUG SCREEN, HOSP PERFORMED  URINALYSIS, ROUTINE W REFLEX MICROSCOPIC    EKG EKG Interpretation  Date/Time:  Thursday November 08 2017 14:27:02 EDT Ventricular Rate:  55 PR Interval:    QRS Duration: 145 QT Interval:  484 QTC Calculation: 463 R Axis:   93 Text Interpretation:  Sinus rhythm Right bundle branch block No old tracing to compare Confirmed by Jacalyn Lefevre [id number]) on 11/08/2017 2:32:56 PM   Radiology Dg Chest 2 View  Result Date: 11/08/2017 CLINICAL DATA:  Cough for 1 week.  Night sweats.  Smoker. EXAM: CHEST - 2 VIEW COMPARISON:  08/26/2012 FINDINGS: Emphysematous changes in the lungs. Peribronchial thickening suggesting chronic bronchitis. No airspace disease or consolidation. No blunting of costophrenic angles. No pneumothorax. Heart size and pulmonary vascularity are normal. Degenerative changes in the spine. 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 No evidence of active pulmonary disease. Electronically Signed   By: Burman Nieves M.D.   On: 11/08/2017 05:51    Procedures Procedures (including critical care time)  Medications Ordered in ED Medications  dicyclomine (BENTYL) tablet 20 mg (has no administration in time range)  hydrOXYzine (ATARAX/VISTARIL) tablet 25 mg (has no administration in time range)  loperamide (IMODIUM) capsule 2-4 mg (has no administration in time range)  methocarbamol (ROBAXIN) tablet 500 mg (has no administration in time range)  naproxen (NAPROSYN) tablet 500 mg (has no administration in time range)  ondansetron (ZOFRAN-ODT) disintegrating tablet 4 mg (has no administration in time range)  cloNIDine (CATAPRES) tablet 0.1 mg (0.1  mg Oral Given 11/08/17 1429)    Followed by  cloNIDine (CATAPRES) tablet 0.1 mg (has no administration in time range)    Followed by  cloNIDine (CATAPRES) tablet 0.1 mg (has no administration in time range)     Initial Impression / Assessment and Plan / ED Course  I have reviewed the triage vital signs and the nursing notes.  Pertinent labs & imaging results that were available during my care of the patient were reviewed by me and considered in my medical decision making (see chart for details).    Pt is anxious, but is feeling a little better.  He has no known abnormal EKGs, but no cp.  He is instructed to f/u with cardiology and pcp.  He is given numbers for outpatient follow up.  Return if worse.  Final Clinical Impressions(s) / ED Diagnoses   Final diagnoses:  Heroin abuse (  HCC)  Right bundle branch block  Anxiety  Tobacco abuse    ED Discharge Orders        Ordered    hydrOXYzine (ATARAX/VISTARIL) 25 MG tablet  Every 6 hours     11/08/17 1514    methocarbamol (ROBAXIN) 500 MG tablet  2 times daily     11/08/17 1514       Jacalyn LefevreHaviland, Steven Basso, MD 11/08/17 1516

## 2017-11-08 NOTE — ED Notes (Addendum)
Patient states he has had a cough and night sweats for 3 days. Patient states he is worried it could be pneumonia. Patient also worried about his blood pressure and states he "worries a lot".

## 2017-11-08 NOTE — ED Triage Notes (Signed)
Pt arriving with complaint of cough and fever. Pt reports he has had a productive cough for the last few days. Pt also reports having night sweats.

## 2017-11-13 ENCOUNTER — Telehealth: Payer: Self-pay | Admitting: Physician Assistant

## 2017-11-13 NOTE — Telephone Encounter (Signed)
Please have patient have ED follow up and to establish care with new provider.  Recommend santiago or [facility].

## 2017-11-14 NOTE — Telephone Encounter (Signed)
Called pt to try and schedule an appt for ED F/U. Did not leave VM due to no DPR.   When pt calls back in, please schedule them [place].   Thanks!

## 2017-12-05 ENCOUNTER — Other Ambulatory Visit: Payer: Self-pay | Admitting: Physician Assistant

## 2017-12-05 DIAGNOSIS — I10 Essential (primary) hypertension: Secondary | ICD-10-CM

## 2017-12-05 NOTE — Telephone Encounter (Signed)
Patient called, left VM to return call back to the office to schedule an appointment before more refills.

## 2018-03-01 ENCOUNTER — Encounter: Payer: Self-pay | Admitting: Physician Assistant

## 2018-03-01 ENCOUNTER — Ambulatory Visit (INDEPENDENT_AMBULATORY_CARE_PROVIDER_SITE_OTHER): Payer: Medicare Other

## 2018-03-01 ENCOUNTER — Ambulatory Visit (INDEPENDENT_AMBULATORY_CARE_PROVIDER_SITE_OTHER): Payer: Medicare Other | Admitting: Physician Assistant

## 2018-03-01 ENCOUNTER — Other Ambulatory Visit: Payer: Self-pay

## 2018-03-01 VITALS — BP 170/80 | HR 68 | Temp 99.1°F | Resp 16 | Ht 74.0 in | Wt 200.0 lb

## 2018-03-01 DIAGNOSIS — J441 Chronic obstructive pulmonary disease with (acute) exacerbation: Secondary | ICD-10-CM

## 2018-03-01 DIAGNOSIS — J22 Unspecified acute lower respiratory infection: Secondary | ICD-10-CM

## 2018-03-01 DIAGNOSIS — R059 Cough, unspecified: Secondary | ICD-10-CM

## 2018-03-01 DIAGNOSIS — I10 Essential (primary) hypertension: Secondary | ICD-10-CM

## 2018-03-01 DIAGNOSIS — R05 Cough: Secondary | ICD-10-CM

## 2018-03-01 DIAGNOSIS — Z0130 Encounter for examination of blood pressure without abnormal findings: Secondary | ICD-10-CM | POA: Diagnosis not present

## 2018-03-01 DIAGNOSIS — F4323 Adjustment disorder with mixed anxiety and depressed mood: Secondary | ICD-10-CM

## 2018-03-01 LAB — POCT URINALYSIS DIP (MANUAL ENTRY)
Bilirubin, UA: NEGATIVE
Blood, UA: NEGATIVE
Glucose, UA: NEGATIVE mg/dL
Ketones, POC UA: NEGATIVE mg/dL
Leukocytes, UA: NEGATIVE
Nitrite, UA: NEGATIVE
Protein Ur, POC: NEGATIVE mg/dL
Spec Grav, UA: 1.025 (ref 1.010–1.025)
Urobilinogen, UA: 0.2 U/dL
pH, UA: 5.5 (ref 5.0–8.0)

## 2018-03-01 MED ORDER — CLONAZEPAM 1 MG PO TABS: ORAL_TABLET | ORAL | 0 refills | Status: DC

## 2018-03-01 MED ORDER — AZITHROMYCIN 250 MG PO TABS: ORAL_TABLET | ORAL | 0 refills | Status: DC

## 2018-03-01 MED ORDER — ALBUTEROL SULFATE HFA 108 (90 BASE) MCG/ACT IN AERS: INHALATION_SPRAY | RESPIRATORY_TRACT | 1 refills | Status: DC

## 2018-03-01 MED ORDER — PREDNISONE 20 MG PO TABS: ORAL_TABLET | ORAL | 0 refills | Status: DC

## 2018-03-01 MED ORDER — CLONIDINE HCL 0.1 MG PO TABS: ORAL_TABLET | ORAL | 2 refills | Status: DC

## 2018-03-01 NOTE — Patient Instructions (Addendum)
  Come back and see me in 2 weeks for blood pressure check.   Start taking Prednisone as directed.  Start taking the antibiotic Azithromycin. Take the ENTIRE COURSE, even if you start to feel better. Do not ever take antibiotics if you are not prescribed them.  Stay well hydrated. Drink 1-2 liters of water/daily.    Thank you for coming in today. I hope you feel we met your needs.  Feel free to call PCP if you have any questions or further requests.  Please consider signing up for MyChart if you do not already have it, as this is a great way to communicate with me.  Best,  Whitney McVey, PA-C   IF you received an x-ray today, you will receive an invoice from [facility]. Please contact [facility] Radiology at [phone] with questions or concerns regarding your invoice.   IF you received labwork today, you will receive an invoice from [place]. Please contact [facility] at [phone] with questions or concerns regarding your invoice.   Our billing staff will not be able to assist you with questions regarding bills from these companies.  You will be contacted with the lab results as soon as they are available. The fastest way to get your results is to activate your My Chart account. Instructions are located on the last page of this paperwork. If you have not heard from [place] regarding the results in 2 weeks, please contact this office.

## 2018-03-01 NOTE — Progress Notes (Signed)
Jerry Spears.  MRN: [id number] DOB: [birth year]  PCP: Sebastian Ache, PA-C  Subjective:  Pt is a 67 year old male who presents to clinic for cough x 2 weeks. He is a former pt of [place].   Cough x 2 weeks. Endorses clear mucus with chest congestion. He is having a tough time coughing up phlegm. Endorses night sweats "I have to get up and change my shirt".   Current every day smoker 1-2 ppd x 50 years. Does not believe he has ever had a chest CT.   Needs medication refills: Asthma - Albuterol HTN - Clonidine 0.1mg  and Amlodipine 5mg . Blood pressure today is 168/88. He has been out of clonidine. He never took the amlodipine.  Anxiety - Klonopin 1mg   Review of Systems  Constitutional: Positive for diaphoresis. Negative for chills, fatigue and fever.  HENT: Positive for congestion. Negative for postnasal drip, rhinorrhea, sinus pressure and sinus pain.   Respiratory: Positive for cough. Negative for chest tightness, shortness of breath and wheezing.   Cardiovascular: Negative for chest pain and palpitations.    There are no active problems to display for this patient.   Current Outpatient Medications on File Prior to Visit  Medication Sig Dispense Refill  . albuterol (PROVENTIL HFA;VENTOLIN HFA) 108 (90 Base) MCG/ACT inhaler INHALE 2 PUFFS EVERY 4 HOURS AS NEEDED FOR WHEEZING & SHORTNESS OF BREATH 8.5 Inhaler 1  . cloNIDine (CATAPRES) 0.1 MG tablet TAKE 1 TABLET (0.1 MG TOTAL) BY MOUTH 2 (TWO) TIMES DAILY, NEED OFFICE VISIT 60 tablet 0  . gabapentin (NEURONTIN) 300 MG capsule TAKE ONE CAPSULE BY MOUTH 3 TIMES A DAY AT BEDTIME 90 capsule 5  . ibuprofen (ADVIL,MOTRIN) 200 MG tablet Take 400 mg by mouth every 6 (six) hours as needed for headache (headache).    . clonazePAM (KLONOPIN) 1 MG tablet TAKE 1 TABLET BY MOUTH EVERY DAY AS NEEDED FOR ANXIETY (Patient not taking: Reported on 11/08/2017) 30 tablet 0  . hydrOXYzine (ATARAX/VISTARIL) 25 MG tablet Take 1  tablet (25 mg total) by mouth every 6 (six) hours. (Patient not taking: Reported on 03/01/2018) 12 tablet 0  . methocarbamol (ROBAXIN) 500 MG tablet Take 1 tablet (500 mg total) by mouth 2 (two) times daily. (Patient not taking: Reported on 03/01/2018) 20 tablet 0   No current facility-administered medications on file prior to visit.     No Known Allergies   Objective:  BP (!) 168/88 (BP Location: Right Arm, Patient Position: Sitting, Cuff Size: Normal)   Pulse 68   Temp 99.1 F (37.3 C) (Oral)   Resp 16   Ht 6\' 2"  (1.88 m)   Wt 200 lb (90.7 kg)   SpO2 97%   BMI 25.68 kg/m   Physical Exam  Constitutional: He is oriented to person, place, and time. He appears well-developed and well-nourished.  Cardiovascular: Normal rate and regular rhythm.  Pulmonary/Chest: Effort normal. No accessory muscle usage. No respiratory distress. He has wheezes in the right upper field and the left upper field. He has rhonchi in the left lower field.  Neurological: He is alert and oriented to person, place, and time.  Skin: Skin is warm and dry.  Psychiatric: He has a normal mood and affect. His behavior is normal. Judgment and thought content normal.  Vitals reviewed.  Dg Chest 2 View  Result Date: 03/01/2018 CLINICAL DATA:  Cough and night sweats for 2 weeks EXAM: CHEST - 2 VIEW COMPARISON:  11/08/2017 FINDINGS: Cardiac shadows within normal  limits. The lungs are hyperinflated consistent with COPD. No focal infiltrate or sizable effusion is seen. No other focal abnormality is noted. IMPRESSION: COPD without acute abnormality. Electronically Signed   By: Alcide CleverMark  Lukens M.D.   On: 03/01/2018 12:09    Assessment and Plan :  1. COPD exacerbation (HCC) - pt c/o cough and congestion x 2 weeks, endorses night sweats. X-ray shows COPD. adventitious lung sounds heard in lower fields. Will cover due to HPI and PE.  - predniSONE (DELTASONE) 20 MG tablet; Take 3 PO QAM x2days, 2 PO QAM x2days, 1 PO QAM x2days   Dispense: 12 tablet; Refill: 0 - azithromycin (ZITHROMAX) 250 MG tablet; Take 2 tabs PO x 1 dose, then 1 tab PO QD x 4 days  Dispense: 6 tablet; Refill: 0  2. Lower respiratory infection (e.g., bronchitis, pneumonia, pneumonitis, pulmonitis) 3. Cough - albuterol (PROVENTIL HFA;VENTOLIN HFA) 108 (90 Base) MCG/ACT inhaler; INHALE 2 PUFFS EVERY 4 HOURS AS NEEDED FOR WHEEZING & SHORTNESS OF BREATH  Dispense: 8.5 Inhaler; Refill: 1 - DG Chest 2 View; Future  4. Essential hypertension 5. Blood pressure check - cloNIDine (CATAPRES) 0.1 MG tablet; TAKE 1 TABLET (0.1 MG TOTAL) BY MOUTH 2 (TWO) TIMES DAILY, NEED OFFICE VISIT  Dispense: 60 tablet; Refill: 2 - POCT urinalysis dipstick - Uncontrolled. Pt has been out of medication x 3 days. Blood pressure today is 168/88. Plan to start Catapres 0.1 mg. RTC in 3 weeks for recheck. Consider low dose CT for smoking history.   6. Adjustment disorder with mixed anxiety and depressed mood - clonazePAM (KLONOPIN) 1 MG tablet; TAKE 1 TABLET BY MOUTH EVERY DAY AS NEEDED FOR ANXIETY  Dispense: 30 tablet; Refill: 0   Marco CollieWhitney Sayer Masini, PA-C  [facility] at [facility] 03/01/2018 11:37 AM  Please note: Portions of this report may have been transcribed using dragon voice recognition software. Every effort was made to ensure accuracy; however, inadvertent computerized transcription errors may be present.

## 2018-03-20 ENCOUNTER — Encounter: Payer: Self-pay | Admitting: Physician Assistant

## 2018-03-20 ENCOUNTER — Ambulatory Visit (INDEPENDENT_AMBULATORY_CARE_PROVIDER_SITE_OTHER): Payer: Medicare Other | Admitting: Physician Assistant

## 2018-03-20 ENCOUNTER — Other Ambulatory Visit: Payer: Self-pay

## 2018-03-20 VITALS — BP 150/82 | HR 64 | Temp 99.0°F | Resp 16 | Ht 74.0 in | Wt 200.2 lb

## 2018-03-20 DIAGNOSIS — Z1211 Encounter for screening for malignant neoplasm of colon: Secondary | ICD-10-CM

## 2018-03-20 DIAGNOSIS — I10 Essential (primary) hypertension: Secondary | ICD-10-CM

## 2018-03-20 DIAGNOSIS — K5900 Constipation, unspecified: Secondary | ICD-10-CM

## 2018-03-20 DIAGNOSIS — Z87891 Personal history of nicotine dependence: Secondary | ICD-10-CM | POA: Diagnosis not present

## 2018-03-20 DIAGNOSIS — Z122 Encounter for screening for malignant neoplasm of respiratory organs: Secondary | ICD-10-CM | POA: Diagnosis not present

## 2018-03-20 MED ORDER — POLYETHYLENE GLYCOL 3350 17 GM/SCOOP PO POWD: 17.0000 g | Freq: Two times a day (BID) | ORAL | 1 refills | Status: DC | PRN

## 2018-03-20 NOTE — Patient Instructions (Addendum)
  You will receive a phone call to schedule an appointment for a colonoscopy and chest CT (lung cancer screening).  [facility] [employer].  Come back and see me in 4-6 weeks.   For constipation  Look up "6 Reasons to Care about [place]" at [place] (WindowBlog.ch)  For treatment of constipation: Use Miralax 1-2 capfuls a day until your stools are soft and regular and then decrease the usage - you can use this daily  1) Water: Make sure you are drinking enough water daily - about 1-3 liters. 2) Fiber: Make sure you are getting enough fiber in your diet - this will make you regular - you can eat high fiber foods or use metamucil as a supplement - it is really important to drink enough water when using fiber supplements. Foods that have a lot of fiber include vegetables, fruits, beans, nuts, oatmeal, and some breads and cereals. You can tell how much fiber is in a food by reading the nutrition label. Doctors recommend eating 25 to 36 grams of fiber each day. 3) Fitness: Increasing your physical activity will help increase the natural movement of your bowels. Try to get 20-30 minutes of exercise daily.  4) Use a 9" stool in front of your toilet to rest your feet on while you have a bowel movement. This will loosen your rectal muscles to help your stool come out easier and prevent straining.    Thank you for coming in today. I hope you feel we met your needs.  Feel free to call PCP if you have any questions or further requests.  Please consider signing up for MyChart if you do not already have it, as this is a great way to communicate with me.  Best,  Whitney McVey, PA-C   IF you received an x-ray today, you will receive an invoice from [facility]. Please contact [facility] Radiology at [phone] with questions or concerns regarding your invoice.   IF you received labwork today, you will receive an invoice from [place].  Please contact [facility] at [phone] with questions or concerns regarding your invoice.   Our billing staff will not be able to assist you with questions regarding bills from these companies.  You will be contacted with the lab results as soon as they are available. The fastest way to get your results is to activate your My Chart account. Instructions are located on the last page of this paperwork. If you have not heard from [place] regarding the results in 2 weeks, please contact this office.

## 2018-03-20 NOTE — Progress Notes (Signed)
Jerry Governhomas E Fahrner Jr.  MRN: [id number] DOB: 04-Nov-1950  PCP: Sebastian AcheMcVey, Raydell Maners Whitney, PA-C  Subjective:  Pt is a [age] year old male who presents to clinic for blood pressure check.   HTN - at his last HTN check on 7/26 he was supposed to be taking Clonidine 0.1mg  and Amlodipine 5mg  however he never took amlodipine and was out of clonidine. Was asked to RTC in 2 weeks for recheck.  Today's blood pressure is 144/80. He is taking Clonidine 0.1mg  twice daily. No side effects.   Current every day smoker 1-1.5 ppd x 50 years. Does not believe he has ever had a chest CT.   Having a tough time with bowel movements x 10 years. Started with pain meds for his back. Percocet PRN - last dose was 1 week ago.  "I just can't relax". Using saline enemas and miralax. Hasn't taken miralax lately - this works the best. Taking milk of magnesia - not helping much. He is having to use enemas every time he has a BM. "It hurts like crazy"    Blood only when he strains "really bad."  Not utd colonoscopy. Never had one.   Review of Systems  Cardiovascular: Negative for chest pain, palpitations and leg swelling.  Gastrointestinal: Positive for blood in stool and constipation. Negative for abdominal pain, diarrhea, nausea, rectal pain and vomiting.    There are no active problems to display for this patient.   Current Outpatient Medications on File Prior to Visit  Medication Sig Dispense Refill  . albuterol (PROVENTIL HFA;VENTOLIN HFA) 108 (90 Base) MCG/ACT inhaler INHALE 2 PUFFS EVERY 4 HOURS AS NEEDED FOR WHEEZING & SHORTNESS OF BREATH 8.5 Inhaler 1  . clonazePAM (KLONOPIN) 1 MG tablet TAKE 1 TABLET BY MOUTH EVERY DAY AS NEEDED FOR ANXIETY 30 tablet 0  . cloNIDine (CATAPRES) 0.1 MG tablet TAKE 1 TABLET (0.1 MG TOTAL) BY MOUTH 2 (TWO) TIMES DAILY, NEED OFFICE VISIT 60 tablet 2  . gabapentin (NEURONTIN) 300 MG capsule TAKE ONE CAPSULE BY MOUTH 3 TIMES A DAY AT BEDTIME 90 capsule 5  . ibuprofen (ADVIL,MOTRIN)  200 MG tablet Take 400 mg by mouth every 6 (six) hours as needed for headache (headache).     No current facility-administered medications on file prior to visit.     No Known Allergies   Objective:  BP (!) 144/80 (BP Location: Left Arm, Patient Position: Sitting, Cuff Size: Normal)   Pulse 64   Temp 99 F (37.2 C) (Oral)   Resp 16   Ht 6\' 2"  (1.88 m)   Wt 200 lb 3.2 oz (90.8 kg)   SpO2 97%   BMI 25.70 kg/m   Physical Exam  Constitutional: He is oriented to person, place, and time. He appears well-developed and well-nourished.  Cardiovascular: Normal rate and regular rhythm.  Pulmonary/Chest: Effort normal. No respiratory distress.  Neurological: He is alert and oriented to person, place, and time.  Skin: Skin is warm and dry.  Psychiatric: He has a normal mood and affect. His behavior is normal. Judgment and thought content normal.  Vitals reviewed.   Assessment and Plan :  1. Essential hypertension - Today's blood pressure is 144/80. Clonidine 0.1mg  twice daily. Encouraged Mr. [name] to start exercising again. RTC in 4-6 weeks to recheck blood pressure.  - Recheck vitals  2. Encounter for screening for malignant neoplasm of respiratory organs 3. History of smoking greater than 50 pack years - CT CHEST LUNG CA SCREEN LOW DOSE W/O CM; Future  4. Screen for colon cancer - Ambulatory referral to Gastroenterology  5. Constipation, unspecified constipation type - Consider pelvic heath referral for constipation if no improvement.  - polyethylene glycol powder (GLYCOLAX/MIRALAX) powder; Take 17 g by mouth 2 (two) times daily as needed.  Dispense: 578 g; Refill: 1   Whitney Charmane Protzman, PA-C  [facility] at [facility] 03/20/2018 10:47 AM  Please note: Portions of this report may have been transcribed using dragon voice recognition software. Every effort was made to ensure accuracy; however, inadvertent computerized transcription errors may be present.

## 2018-03-27 ENCOUNTER — Other Ambulatory Visit: Payer: Self-pay | Admitting: Physician Assistant

## 2018-03-27 DIAGNOSIS — F4323 Adjustment disorder with mixed anxiety and depressed mood: Secondary | ICD-10-CM

## 2018-03-27 NOTE — Telephone Encounter (Signed)
Refill of Klonopin  LOV 03/20/18  LRF 03/01/18  #30  0 refills  [id number] - [place], [place] - [address] [phone] (Phone

## 2018-03-27 NOTE — Telephone Encounter (Signed)
Copied from CRM [id number]. Topic: Quick Communication - Rx Refill/Question >> Mar 27, 2018  2:52 PM Floria RavelingStovall, Shana A wrote: Medication: clonazePAM (KLONOPIN) 1 MG tablet [[id number]  Has the patient contacted their pharmacy? No  (Agent: If no, request that the patient contact the pharmacy for the refill.) (Agent: If yes, when and what did the pharmacy advise?)  Preferred Pharmacy (with phone number or street name): [id number] - [place], [place] - [address] [phone] (Phone   Agent: Please be advised that RX refills may take up to 3 business days. We ask that you follow-up with your pharmacy.

## 2018-03-29 ENCOUNTER — Other Ambulatory Visit: Payer: Self-pay | Admitting: Physician Assistant

## 2018-03-29 DIAGNOSIS — F4323 Adjustment disorder with mixed anxiety and depressed mood: Secondary | ICD-10-CM

## 2018-03-29 NOTE — Telephone Encounter (Signed)
Klonopin refill Last Refill:03/01/18 #30 Last OV: 03/20/18 PCP: Alphonzo LemmingsWhitney McVey,PA

## 2018-03-31 MED ORDER — CLONAZEPAM 1 MG PO TABS: ORAL_TABLET | ORAL | 0 refills | Status: DC

## 2018-04-02 ENCOUNTER — Other Ambulatory Visit: Payer: Self-pay | Admitting: Physician Assistant

## 2018-04-02 ENCOUNTER — Telehealth: Payer: Self-pay | Admitting: *Deleted

## 2018-04-02 DIAGNOSIS — G8929 Other chronic pain: Secondary | ICD-10-CM

## 2018-04-02 DIAGNOSIS — M549 Dorsalgia, unspecified: Secondary | ICD-10-CM

## 2018-04-02 MED ORDER — GABAPENTIN 300 MG PO CAPS: ORAL_CAPSULE | ORAL | 5 refills | Status: DC

## 2018-04-02 NOTE — Telephone Encounter (Signed)
Pt contacted by phone Rx  Is ready for pick up at 102.

## 2018-04-16 ENCOUNTER — Ambulatory Visit: Payer: Medicare Other | Admitting: Physician Assistant

## 2018-05-01 ENCOUNTER — Other Ambulatory Visit: Payer: Self-pay | Admitting: Physician Assistant

## 2018-05-01 DIAGNOSIS — F4323 Adjustment disorder with mixed anxiety and depressed mood: Secondary | ICD-10-CM

## 2018-05-01 NOTE — Telephone Encounter (Signed)
Clonazepam refill Last Refill:03/31/18 #30 Last OV: 03/01/18 PCP: Alphonzo Lemmings McVey,PA

## 2018-05-02 ENCOUNTER — Encounter: Payer: Self-pay | Admitting: Physician Assistant

## 2018-05-23 ENCOUNTER — Other Ambulatory Visit: Payer: Self-pay | Admitting: Physician Assistant

## 2018-05-23 DIAGNOSIS — I10 Essential (primary) hypertension: Secondary | ICD-10-CM

## 2018-05-23 NOTE — Telephone Encounter (Signed)
Requested medication (s) are due for refill today:   Yes.  Had appt 04/16/18 - No Show with McVey.     Letter was sent out 05/02/18 due to inability to make appt.     Requested medication (s) are on the active medication list:   Yes  Future visit scheduled:   No   Last ordered: 03/01/18  #60  Refills 2   Requested Prescriptions  Pending Prescriptions Disp Refills   cloNIDine (CATAPRES) 0.1 MG tablet [Pharmacy Med Name: CLONIDINE HCL 0.1 MG TABLET] 60 tablet 2    Sig: TAKE 1 TABLET (0.1 MG TOTAL) BY MOUTH 2 (TWO) TIMES DAILY, NEED OFFICE VISIT     Cardiovascular:  Alpha-2 Agonists Failed - 05/23/2018  1:56 AM      Failed - Last BP in normal range    BP Readings from Last 1 Encounters:  03/20/18 (!) 150/82         Passed - Last Heart Rate in normal range    Pulse Readings from Last 1 Encounters:  03/20/18 64         Passed - Valid encounter within last 6 months    Recent Outpatient Visits          2 months ago Essential hypertension   Primary Care at [facility], Madelaine Bhat, PA-C   2 months ago COPD exacerbation [facility])   Primary Care at [facility], Madelaine Bhat, PA-C   1 year ago Essential hypertension   Primary Care at [place], [place], [place]   1 year ago Pain in both feet   Primary Care at [place], [place], [place]   1 year ago Lower respiratory infection (e.g., bronchitis, pneumonia, pneumonitis, pulmonitis)   Primary Care at [place], [place], [place]

## 2018-05-30 ENCOUNTER — Telehealth: Payer: Self-pay | Admitting: Physician Assistant

## 2018-05-30 NOTE — Telephone Encounter (Signed)
Klonopin refill request has been pended by [employer].

## 2018-05-30 NOTE — Telephone Encounter (Signed)
Copied from CRM [id number]. Topic: Quick Communication - Rx Refill/Question >> May 30, 2018 11:15 AM Jaquita Rector A wrote: Medication: clonazePAM (KLONOPIN) 1 MG tablet  Patient is all out of medication  Has the patient contacted their pharmacy? Yes.     Preferred Pharmacy (with phone number or street name): [id number] - [place], [place] - [address] [phone] ([phone] (Fax)    Agent: Please be advised that RX refills may take up to 3 business days. We ask that you follow-up with your pharmacy.

## 2018-06-03 NOTE — Telephone Encounter (Signed)
Patient called to request information on refill of clonazePAM (KLONOPIN) 1 MG tablet

## 2018-06-03 NOTE — Telephone Encounter (Signed)
Please advise 

## 2018-06-06 ENCOUNTER — Telehealth: Payer: Self-pay | Admitting: Physician Assistant

## 2018-06-06 NOTE — Telephone Encounter (Signed)
[place] from [facility] called patient needs refills.awaiting for response from [facility] per [facility]

## 2018-06-06 NOTE — Telephone Encounter (Signed)
Message forwarded to [facility] Re: refill clonazepam

## 2018-06-06 NOTE — Telephone Encounter (Signed)
Message sent to McVey 

## 2018-06-06 NOTE — Telephone Encounter (Signed)
Patient is calling back to state that he has not heard from the office yet regarding his medication refill for clonazePAM (KLONOPIN) 1 MG tablet. Patient has been requesting this refill since 10/24, he also states that [facility] gives this to him as needed for anxiety and that he has been out awhile. I called over to the office and was advised to put another phone note in. Can this please be forwarded to [facility]?

## 2018-06-11 NOTE — Telephone Encounter (Signed)
Pt is calling and he is out of medication . Pt needs refill on clonazepam

## 2018-06-14 ENCOUNTER — Other Ambulatory Visit: Payer: Self-pay | Admitting: Physician Assistant

## 2018-06-14 DIAGNOSIS — F4323 Adjustment disorder with mixed anxiety and depressed mood: Secondary | ICD-10-CM

## 2018-06-14 MED ORDER — CLONAZEPAM 1 MG PO TABS: ORAL_TABLET | ORAL | 0 refills | Status: DC

## 2018-06-19 ENCOUNTER — Other Ambulatory Visit: Payer: Self-pay | Admitting: Physician Assistant

## 2018-06-19 DIAGNOSIS — J22 Unspecified acute lower respiratory infection: Secondary | ICD-10-CM

## 2018-06-19 NOTE — Telephone Encounter (Signed)
Requested Prescriptions  Pending Prescriptions Disp Refills  . albuterol (PROAIR HFA) 108 (90 Base) MCG/ACT inhaler [Pharmacy Med Name: PROAIR HFA 90 MCG INHALER] 8.5 Inhaler 1    Sig: INHALE 2 PUFFS EVERY 4 HOURS AS NEEDED FOR WHEEZING AND SHORTNESS OF BREATH     Pulmonology:  Beta Agonists Failed - 06/19/2018  1:45 AM      Failed - One inhaler should last at least one month. If the patient is requesting refills earlier, contact the patient to check for uncontrolled symptoms.      Passed - Valid encounter within last 12 months    Recent Outpatient Visits          3 months ago Essential hypertension   [facility], Madelaine BhatElizabeth Whitney, PA-C   3 months ago COPD exacerbation [facility]   [facility], Madelaine BhatElizabeth Whitney, PA-C   1 year ago Essential hypertension   [facility], [place], [place]   1 year ago Pain in both feet   [facility], [place], [place]   1 year ago Lower respiratory infection (e.g., bronchitis, pneumonia, pneumonitis, pulmonitis)   [facility], [place], [place]

## 2018-06-25 ENCOUNTER — Other Ambulatory Visit: Payer: Self-pay | Admitting: Physician Assistant

## 2018-06-25 DIAGNOSIS — I10 Essential (primary) hypertension: Secondary | ICD-10-CM

## 2018-07-29 ENCOUNTER — Other Ambulatory Visit: Payer: Self-pay | Admitting: Physician Assistant

## 2018-07-29 DIAGNOSIS — I10 Essential (primary) hypertension: Secondary | ICD-10-CM

## 2018-07-29 NOTE — Telephone Encounter (Signed)
Courtesy refill. Message left to make appointment.

## 2018-08-24 ENCOUNTER — Other Ambulatory Visit: Payer: Self-pay | Admitting: Physician Assistant

## 2018-08-24 DIAGNOSIS — I10 Essential (primary) hypertension: Secondary | ICD-10-CM

## 2018-09-03 ENCOUNTER — Ambulatory Visit: Payer: Medicare Other | Admitting: Emergency Medicine

## 2018-09-12 ENCOUNTER — Ambulatory Visit: Payer: Medicare Other | Admitting: Emergency Medicine

## 2018-09-22 ENCOUNTER — Other Ambulatory Visit: Payer: Self-pay | Admitting: Physician Assistant

## 2018-09-22 DIAGNOSIS — I10 Essential (primary) hypertension: Secondary | ICD-10-CM

## 2018-09-22 DIAGNOSIS — J22 Unspecified acute lower respiratory infection: Secondary | ICD-10-CM

## 2018-09-23 NOTE — Telephone Encounter (Signed)
Requested medication (s) are due for refill today -yes  Requested medication (s) are on the active medication list -yes  Future visit scheduled -no  Last refill: 08/26/18  Notes to clinic: Patient is requesting medication that has already had courtesy refill. Sent for PCP review of request  Requested Prescriptions  Pending Prescriptions Disp Refills   cloNIDine (CATAPRES) 0.1 MG tablet [Pharmacy Med Name: CLONIDINE HCL 0.1 MG TABLET] 60 tablet 0    Sig: TAKE 1 TABLET (0.1 MG TOTAL) BY MOUTH 2 (TWO) TIMES DAILY, NEED OFFICE VISIT     Cardiovascular:  Alpha-2 Agonists Failed - 09/22/2018  1:14 AM      Failed - Last BP in normal range    BP Readings from Last 1 Encounters:  03/20/18 (!) 150/82         Failed - Valid encounter within last 6 months    Recent Outpatient Visits          6 months ago Essential hypertension   Primary Care at [facility], [place], PA-C   6 months ago COPD exacerbation [facility])   Primary Care at [facility], Madelaine Bhat, PA-C   1 year ago Essential hypertension   Primary Care at [place], [place], [place]   1 year ago Pain in both feet   Primary Care at [place], [place], [place]   1 year ago Lower respiratory infection (e.g., bronchitis, pneumonia, pneumonitis, pulmonitis)   Primary Care at [place], [place], [place]             Passed - Last Heart Rate in normal range    Pulse Readings from Last 1 Encounters:  03/20/18 64       Signed Prescriptions Disp Refills   albuterol (PROAIR HFA) 108 (90 Base) MCG/ACT inhaler 8.5 Inhaler 1    Sig: INHALE 2 PUFFS EVERY 4 HOURS AS NEEDED FOR WHEEZING AND SHORTNESS OF BREATH     Pulmonology:  Beta Agonists Failed - 09/22/2018  1:14 AM      Failed - One inhaler should last at least one month. If the patient is requesting refills earlier, contact the patient to check for uncontrolled symptoms.      Passed - Valid encounter within last 12 months    Recent Outpatient Visits        6 months ago Essential hypertension   Primary Care at [facility], Madelaine Bhat, PA-C   6 months ago COPD exacerbation [facility])   Primary Care at [facility], Madelaine Bhat, PA-C   1 year ago Essential hypertension   Primary Care at [place], [place], [place]   1 year ago Pain in both feet   Primary Care at [place], [place], [place]   1 year ago Lower respiratory infection (e.g., bronchitis, pneumonia, pneumonitis, pulmonitis)   Primary Care at [place], [place], [place]              Requested Prescriptions  Pending Prescriptions Disp Refills   cloNIDine (CATAPRES) 0.1 MG tablet [Pharmacy Med Name: CLONIDINE HCL 0.1 MG TABLET] 60 tablet 0    Sig: TAKE 1 TABLET (0.1 MG TOTAL) BY MOUTH 2 (TWO) TIMES DAILY, NEED OFFICE VISIT     Cardiovascular:  Alpha-2 Agonists Failed - 09/22/2018  1:14 AM      Failed - Last BP in normal range    BP Readings from Last 1 Encounters:  03/20/18 (!) 150/82         Failed - Valid encounter within  last 6 months    Recent Outpatient Visits          6 months ago Essential hypertension   Primary Care at [facility], Madelaine Bhat, PA-C   6 months ago COPD exacerbation [facility])   Primary Care at [facility], Madelaine Bhat, PA-C   1 year ago Essential hypertension   Primary Care at [place], [place], [place]   1 year ago Pain in both feet   Primary Care at [place], [place], [place]   1 year ago Lower respiratory infection (e.g., bronchitis, pneumonia, pneumonitis, pulmonitis)   Primary Care at [place], [place], [place]             Passed - Last Heart Rate in normal range    Pulse Readings from Last 1 Encounters:  03/20/18 64       Signed Prescriptions Disp Refills   albuterol (PROAIR HFA) 108 (90 Base) MCG/ACT inhaler 8.5 Inhaler 1    Sig: INHALE 2 PUFFS EVERY 4 HOURS AS NEEDED FOR WHEEZING AND SHORTNESS OF BREATH     Pulmonology:  Beta Agonists Failed - 09/22/2018  1:14 AM      Failed -  One inhaler should last at least one month. If the patient is requesting refills earlier, contact the patient to check for uncontrolled symptoms.      Passed - Valid encounter within last 12 months    Recent Outpatient Visits          6 months ago Essential hypertension   Primary Care at [facility], Madelaine Bhat, PA-C   6 months ago COPD exacerbation [facility])   Primary Care at [facility], Madelaine Bhat, PA-C   1 year ago Essential hypertension   Primary Care at [place], [place], [place]   1 year ago Pain in both feet   Primary Care at [place], [place], [place]   1 year ago Lower respiratory infection (e.g., bronchitis, pneumonia, pneumonitis, pulmonitis)   Primary Care at [place], [place], [place]

## 2018-10-22 ENCOUNTER — Other Ambulatory Visit: Payer: Self-pay | Admitting: Physician Assistant

## 2018-10-22 ENCOUNTER — Other Ambulatory Visit: Payer: Self-pay | Admitting: Family Medicine

## 2018-10-22 DIAGNOSIS — F4323 Adjustment disorder with mixed anxiety and depressed mood: Secondary | ICD-10-CM

## 2018-10-22 DIAGNOSIS — I10 Essential (primary) hypertension: Secondary | ICD-10-CM

## 2018-10-22 NOTE — Telephone Encounter (Signed)
Patient is requesting a refill of the following medications: Requested Prescriptions   Pending Prescriptions Disp Refills  . clonazePAM (KLONOPIN) 1 MG tablet [Pharmacy Med Name: CLONAZEPAM 1 MG TABLET] 30 tablet 0    Sig: TAKE 1 TABLET BY MOUTH DAILY AS NEEDED FOR ANXIETY    Date of patient request: 10/22/2018 Last office visit: 03/20/2018 Date of last refill: 06/14/2018 Last refill amount: 30 tab Follow up time period per chart: n/a pt no show X2

## 2018-10-22 NOTE — Telephone Encounter (Signed)
Medication is not delegated.  Routing to provider for review/approval

## 2018-10-22 NOTE — Telephone Encounter (Signed)
Medication was filled on 08-26-2018 and it was noted patient needed an appointment / Appointment scheduled for 09-12-2018 and patient no showed. / No other appointment scheduled at this time. /

## 2018-11-05 ENCOUNTER — Encounter: Payer: Self-pay | Admitting: Emergency Medicine

## 2018-11-05 ENCOUNTER — Ambulatory Visit
Admission: EM | Admit: 2018-11-05 | Discharge: 2018-11-05 | Disposition: A | Discharge status: Home / Self Care | Payer: Medicare Other | Attending: Physician Assistant | Admitting: Physician Assistant

## 2018-11-05 ENCOUNTER — Other Ambulatory Visit: Payer: Self-pay

## 2018-11-05 DIAGNOSIS — J22 Unspecified acute lower respiratory infection: Secondary | ICD-10-CM | POA: Diagnosis not present

## 2018-11-05 DIAGNOSIS — B349 Viral infection, unspecified: Secondary | ICD-10-CM | POA: Diagnosis not present

## 2018-11-05 MED ORDER — IPRATROPIUM BROMIDE 0.06 % NA SOLN: 2.0000 | Freq: Four times a day (QID) | NASAL | 12 refills | Status: DC

## 2018-11-05 MED ORDER — BENZONATATE 200 MG PO CAPS: 200.0000 mg | ORAL_CAPSULE | Freq: Three times a day (TID) | ORAL | 0 refills | Status: DC

## 2018-11-05 MED ORDER — ALBUTEROL SULFATE HFA 108 (90 BASE) MCG/ACT IN AERS: INHALATION_SPRAY | RESPIRATORY_TRACT | 1 refills | Status: DC

## 2018-11-05 NOTE — ED Triage Notes (Signed)
Pt presents to [facility] for assessment of cough developing 2 days ago.  Pt states he also had a fever yesterday of 100.4-100.9.

## 2018-11-05 NOTE — ED Notes (Signed)
Patient able to ambulate independently  

## 2018-11-05 NOTE — Discharge Instructions (Signed)
As discussed, cannot rule out COVID. Currently, no alarming signs. Albuterol as needed. Tessalon for cough. Atrovent nasal spray for nasal congestion/drainage.  I would like you to quarantine for 7 days since symptoms onset AND >72 hours without fever, and improved respiratory symptoms. Continue to monitor, you can call COVID hotline [phone]) or use [facility] E visit online if symptoms worsens to determine where you should seek care. If experiencing shortness of breath, trouble breathing, call 911 and provide them with your current situation.

## 2018-11-05 NOTE — ED Provider Notes (Signed)
[facility]    CSN: [id number] Arrival date & time: 11/05/18  1012     History   Chief Complaint Chief Complaint  Patient presents with  . Cough    HPI Jerry Spears. is a 68 y.o. male.   68 year old male with history of anxiety, chronic back pain, HTN comes in for 2 day history of URI symptoms. He has had cough, nasal congestion, rhinorrhea, fever. States Tmax about 100.9, last took tylenol last night. He denies chest pain, shortness of breath, wheezing. States he usually uses albuterol prior to bedtime to help him sleep, and he has not needed to use more. He denies chills, body aches. No sick contact. No recent travels outside of state. Has been tylenol for symptoms. Current every day smoker.      Past Medical History:  Diagnosis Date  . Anxiety   . Chronic back pain   . DDD (degenerative disc disease), lumbar     There are no active problems to display for this patient.   History reviewed. No pertinent surgical history.     Home Medications    Prior to Admission medications   Medication Sig Start Date End Date Taking? Authorizing Provider  clonazePAM (KLONOPIN) 1 MG tablet TAKE 1 TABLET BY MOUTH EVERY DAY AS NEEDED FOR ANXIETY 06/14/18  Yes McVey, Madelaine Bhat, PA-C  albuterol (PROAIR HFA) 108 (90 Base) MCG/ACT inhaler INHALE 2 PUFFS EVERY 4 HOURS AS NEEDED FOR WHEEZING AND SHORTNESS OF BREATH 11/05/18   Cathie Hoops, Amy V, PA-C  benzonatate (TESSALON) 200 MG capsule Take 1 capsule (200 mg total) by mouth every 8 (eight) hours. 11/05/18   Cathie Hoops, Amy V, PA-C  cloNIDine (CATAPRES) 0.1 MG tablet TAKE 1 TABLET (0.1 MG TOTAL) BY MOUTH 2 (TWO) TIMES DAILY, NEED OFFICE VISIT 08/26/18   McVey, Madelaine Bhat, PA-C  gabapentin (NEURONTIN) 300 MG capsule Take one capsule 3x/day 04/02/18   McVey, Madelaine Bhat, PA-C  ibuprofen (ADVIL,MOTRIN) 200 MG tablet Take 400 mg by mouth every 6 (six) hours as needed for headache (headache).    [provider]   ipratropium (ATROVENT) 0.06 % nasal spray Place 2 sprays into both nostrils 4 (four) times daily. 11/05/18   Cathie Hoops, Amy V, PA-C  polyethylene glycol powder (GLYCOLAX/MIRALAX) powder Take 17 g by mouth 2 (two) times daily as needed. 03/20/18   McVey, Madelaine Bhat, PA-C    Family History History reviewed. No pertinent family history.  Social History Social History   Tobacco Use  . Smoking status: Current Every Day Smoker    Packs/day: 1.00    Types: Cigarettes  . Smokeless tobacco: Current User  Substance Use Topics  . Alcohol use: Yes    Comment: occ  . Drug use: No     Allergies   Patient has no known allergies.   Review of Systems Review of Systems  Reason unable to perform ROS: See HPI as above.     Physical Exam Triage Vital Signs ED Triage Vitals  Enc Vitals Group     BP --      Pulse --      Resp --      Temp 11/05/18 1024 98.7 F (37.1 C)     Temp Source 11/05/18 1024 Oral     SpO2 --      Weight --      Height --      Head Circumference --      Peak Flow --  Pain Score 11/05/18 1023 0     Pain Loc --      Pain Edu? --      Excl. in GC? --    No data found.  Updated Vital Signs BP (!) 147/79 (BP Location: Left Arm)   Pulse 60   Temp 98.7 F (37.1 C) (Oral) Comment: Pt took Tylenol last night  Resp 18   SpO2 94%   Physical Exam Constitutional:      General: He is not in acute distress.    Appearance: He is well-developed. He is not ill-appearing, toxic-appearing or diaphoretic.  HENT:     Head: Normocephalic and atraumatic.     Right Ear: Tympanic membrane, ear canal and external ear normal. Tympanic membrane is not erythematous or bulging.     Left Ear: Tympanic membrane, ear canal and external ear normal. Tympanic membrane is not erythematous or bulging.     Nose: Nose normal.     Right Sinus: No maxillary sinus tenderness or frontal sinus tenderness.     Left Sinus: No maxillary sinus tenderness or frontal sinus tenderness.      Mouth/Throat:     Mouth: Mucous membranes are moist.     Pharynx: Oropharynx is clear. Uvula midline.  Eyes:     Conjunctiva/sclera: Conjunctivae normal.     Pupils: Pupils are equal, round, and reactive to light.  Neck:     Musculoskeletal: Normal range of motion and neck supple.  Cardiovascular:     Rate and Rhythm: Normal rate and regular rhythm.     Heart sounds: Normal heart sounds. No murmur. No friction rub. No gallop.   Pulmonary:     Effort: Pulmonary effort is normal. No accessory muscle usage, prolonged expiration, respiratory distress or retractions.     Comments: Patient able to speak in full sentences without difficulty. Mild expiratory wheezing in the periphery. Rhonchi diffusely that resolved with cough. No rales heard. Good air movement.  Skin:    General: Skin is warm and dry.  Neurological:     Mental Status: He is alert and oriented to person, place, and time.      UC Treatments / Results  Labs (all labs ordered are listed, but only abnormal results are displayed) Labs Reviewed - No data to display  EKG None  Radiology No results found.  Procedures Procedures (including critical care time)  Medications Ordered in UC Medications - No data to display  Initial Impression / Assessment and Plan / UC Course  I have reviewed the triage vital signs and the nursing notes.  Pertinent labs & imaging results that were available during my care of the patient were reviewed by me and considered in my medical decision making (see chart for details).    Patient speaking in full sentences without respiratory distress. He is afebrile in office without antipyretic. No tachycardia, tachypnea. Mild expiratory wheezing in periphery, with rhonchi diffusely that resolved with cough. COVID testing limited to inpatient. Given history and exam, will have patient self quarantine. Instructions on when to end quarantine, roommates/family members isolation discussed, and resources  provided. Symptomatic treatment discussed. Return precautions given. Patient expresses understanding and agrees to plan.  Final Clinical Impressions(s) / UC Diagnoses   Final diagnoses:  Viral illness   ED Prescriptions    Medication Sig Dispense Auth. Provider   benzonatate (TESSALON) 200 MG capsule Take 1 capsule (200 mg total) by mouth every 8 (eight) hours. 21 capsule Yu, Amy V, PA-C   ipratropium (ATROVENT) 0.06 %  nasal spray Place 2 sprays into both nostrils 4 (four) times daily. 15 mL Yu, Amy V, PA-C   albuterol (PROAIR HFA) 108 (90 Base) MCG/ACT inhaler INHALE 2 PUFFS EVERY 4 HOURS AS NEEDED FOR WHEEZING AND SHORTNESS OF BREATH 8.5 Inhaler Threasa Alpha, [place] 11/05/18 1107

## 2018-11-08 ENCOUNTER — Telehealth: Payer: Self-pay | Admitting: Physician Assistant

## 2018-11-08 NOTE — Telephone Encounter (Signed)
Please see note below and refill if ok.

## 2018-11-08 NOTE — Telephone Encounter (Signed)
Copied from CRM [id number]. Topic: Quick Communication - Rx Refill/Question >> Nov 08, 2018  3:44 PM Richarda Blade wrote: Medication: clonazePAM (KLONOPIN) 1 MG tablet [[id number]]  cloNIDine (CATAPRES) 0.1 MG tablet [[id number]]  gabapentin (NEURONTIN) 300 MG capsule [[id number]]  Has the patient contacted their pharmacy? No. (Agent: If no, request that the patient contact the pharmacy for the refill.) Has no refills refills left.   Preferred Pharmacy (with phone number or street name): [id number] [place], [place] - [address] [phone] (Phone) [phone] (Fax)    Agent: Please be advised that RX refills may take up to 3 business days. We ask that you follow-up with your pharmacy.

## 2018-11-11 NOTE — Telephone Encounter (Signed)
Patient needs OV with any provider (other than Dr Neva Seat) to [facility] care.  Previous PCP McVey, last OV Aug 2019 Meds declined at this time Pmp reviewed, does not take clonazepam daily thanks

## 2018-11-28 ENCOUNTER — Telehealth (INDEPENDENT_AMBULATORY_CARE_PROVIDER_SITE_OTHER): Payer: Medicare Other | Admitting: Emergency Medicine

## 2018-11-28 ENCOUNTER — Telehealth: Payer: Self-pay | Admitting: *Deleted

## 2018-11-28 ENCOUNTER — Other Ambulatory Visit: Payer: Self-pay

## 2018-11-28 NOTE — Telephone Encounter (Signed)
Dr Alvy Bimler tried to contact patient by Webx for his appointment and patient did not connect.

## 2018-11-28 NOTE — Progress Notes (Signed)
Attempted to contact twice via WebEx without success.

## 2018-11-28 NOTE — Telephone Encounter (Signed)
Called patient to triage for 8:20 am Webx. Left message in mobile voice mail to call back.

## 2018-12-13 DIAGNOSIS — R05 Cough: Secondary | ICD-10-CM | POA: Diagnosis not present

## 2019-02-17 ENCOUNTER — Other Ambulatory Visit: Payer: Self-pay | Admitting: Physician Assistant

## 2019-02-17 DIAGNOSIS — G8929 Other chronic pain: Secondary | ICD-10-CM

## 2019-03-01 ENCOUNTER — Other Ambulatory Visit: Payer: Self-pay

## 2019-03-01 ENCOUNTER — Encounter (HOSPITAL_COMMUNITY): Payer: Self-pay | Admitting: Emergency Medicine

## 2019-03-01 ENCOUNTER — Emergency Department (HOSPITAL_COMMUNITY)
Admission: EM | Admit: 2019-03-01 | Discharge: 2019-03-01 | Discharge status: Against Medical Advice | Payer: Medicare Other | Attending: Emergency Medicine | Admitting: Emergency Medicine

## 2019-03-01 DIAGNOSIS — Z5321 Procedure and treatment not carried out due to patient leaving prior to being seen by health care provider: Secondary | ICD-10-CM | POA: Insufficient documentation

## 2019-03-01 DIAGNOSIS — K5900 Constipation, unspecified: Secondary | ICD-10-CM | POA: Diagnosis not present

## 2019-03-01 DIAGNOSIS — I10 Essential (primary) hypertension: Secondary | ICD-10-CM | POA: Diagnosis not present

## 2019-03-01 DIAGNOSIS — R5381 Other malaise: Secondary | ICD-10-CM | POA: Diagnosis not present

## 2019-03-01 NOTE — ED Triage Notes (Signed)
Out of triage bathroom and back in triage room. Pt states that he was able to have a "super large amount that I can't believe came out of me". Pt states that he is feeling much better and wants to go home. Pt calling ride to come get him.

## 2019-03-01 NOTE — ED Triage Notes (Signed)
Per [facility] pt from home for constipation x 4 days. Is chornic problem per his normal and takes Miralax every other day. Did an enema today but no success. Pt reports straining to point of feeling like going to pass out.  Vitals: 1682/90, HR 71, 96% on room air. 96.8 temp.

## 2019-03-01 NOTE — ED Triage Notes (Signed)
Pt adds that he has been taking Miralax everyday but ran out a few days ago. Pt standing and pacing and constantly going baCK [place] RESTROOM.

## 2019-03-28 ENCOUNTER — Other Ambulatory Visit: Payer: Self-pay | Admitting: Physician Assistant

## 2019-03-28 DIAGNOSIS — J22 Unspecified acute lower respiratory infection: Secondary | ICD-10-CM

## 2019-03-28 NOTE — Telephone Encounter (Signed)
Forwarding medication refill request to clinical pool for review. 

## 2019-04-02 ENCOUNTER — Other Ambulatory Visit: Payer: Self-pay | Admitting: Physician Assistant

## 2019-04-02 DIAGNOSIS — M549 Dorsalgia, unspecified: Secondary | ICD-10-CM

## 2019-04-02 DIAGNOSIS — G8929 Other chronic pain: Secondary | ICD-10-CM

## 2019-04-02 NOTE — Telephone Encounter (Signed)
Requested medication (s) are due for refill today: yes  Requested medication (s) are on the active medication list: yes  Last refill:  02/17/2019  Future visit scheduled: no  Notes to clinic:  Review for refill Spoke with patient and he states that he will contact the office to schedule appointment    Requested Prescriptions  Pending Prescriptions Disp Refills   gabapentin (NEURONTIN) 300 MG capsule [Pharmacy Med Name: GABAPENTIN 300 MG CAPSULE] 270 capsule 1    Sig: TAKE 1 [place]     Neurology: Anticonvulsants - gabapentin Failed - 04/02/2019 12:22 PM      Failed - Valid encounter within last 12 months    Recent Outpatient Visits          1 year ago Essential hypertension   Primary Care at [facility], Gelene Mink, PA-C   1 year ago COPD exacerbation [facility])   Primary Care at [facility], Gelene Mink, PA-C   1 year ago Essential hypertension   Primary Care at [facility], [place], [place]   2 years ago Pain in both feet   Primary Care at [facility], [place], [place]   2 years ago Lower respiratory infection (e.g., bronchitis, pneumonia, pneumonitis, pulmonitis)   Primary Care at [facility] [place] [facility], [place], [place]

## 2019-04-08 ENCOUNTER — Other Ambulatory Visit: Payer: Self-pay | Admitting: Physician Assistant

## 2019-04-08 DIAGNOSIS — G8929 Other chronic pain: Secondary | ICD-10-CM

## 2019-04-08 DIAGNOSIS — M549 Dorsalgia, unspecified: Secondary | ICD-10-CM

## 2019-04-28 ENCOUNTER — Other Ambulatory Visit: Payer: Self-pay | Admitting: Physician Assistant

## 2019-04-28 DIAGNOSIS — M549 Dorsalgia, unspecified: Secondary | ICD-10-CM

## 2019-04-28 DIAGNOSIS — J22 Unspecified acute lower respiratory infection: Secondary | ICD-10-CM

## 2019-04-28 DIAGNOSIS — G8929 Other chronic pain: Secondary | ICD-10-CM

## 2019-04-29 NOTE — Telephone Encounter (Signed)
Requested medication (s) are due for refill today: yes  Requested medication (s) are on the active medication list: yes  Last refill:  02/17/2019  Future visit scheduled: no  Notes to clinic:  Review for refill   Requested Prescriptions  Pending Prescriptions Disp Refills   gabapentin (NEURONTIN) 300 MG capsule [Pharmacy Med Name: GABAPENTIN 300 MG CAPSULE] 90 capsule 0    Sig: TAKE 1 [place]     Neurology: Anticonvulsants - gabapentin Failed - 04/28/2019  6:46 PM      Failed - Valid encounter within last 12 months    Recent Outpatient Visits          5 months ago    Primary Care at [facility], Ines Bloomer, MD   1 year ago Essential hypertension   Primary Care at [facility], Gelene Mink, PA-C   1 year ago COPD exacerbation [facility])   Primary Care at [facility], Gelene Mink, PA-C   2 years ago Essential hypertension   Primary Care at [facility] [place] [facility], [place], [place]   2 years ago Pain in both feet   Primary Care at [facility] [place] [facility], [place], [place]              albuterol (VENTOLIN HFA) 108 (90 Base) MCG/ACT inhaler [Pharmacy Med Name: ALBUTEROL HFA (PROAIR) INHALER]  1    Sig: INHALE 2 PUFFS EVERY 4 HOURS AS NEEDED FOR WHEEZING AND SHORTNESS OF BREATH     Pulmonology:  Beta Agonists Failed - 04/28/2019  6:46 PM      Failed - One inhaler should last at least one month. If the patient is requesting refills earlier, contact the patient to check for uncontrolled symptoms.      Failed - Valid encounter within last 12 months    Recent Outpatient Visits          5 months ago    Primary Care at [facility], Ines Bloomer, MD   1 year ago Essential hypertension   Primary Care at [facility], Gelene Mink, PA-C   1 year ago COPD exacerbation [facility])   Primary Care at [facility], Gelene Mink, PA-C   2 years ago Essential hypertension   Primary Care at [facility], [place], [place]   2 years ago Pain in both feet   Primary Care at [facility], [place], [place]

## 2019-06-05 ENCOUNTER — Other Ambulatory Visit: Payer: Self-pay | Admitting: Physician Assistant

## 2019-06-05 DIAGNOSIS — M549 Dorsalgia, unspecified: Secondary | ICD-10-CM

## 2019-06-05 DIAGNOSIS — G8929 Other chronic pain: Secondary | ICD-10-CM

## 2019-06-05 NOTE — Telephone Encounter (Signed)
Requested medication (s) are due for refill today: yes  Requested medication (s) are on the active medication list: yes  Last refill:  02/17/2019  Future visit scheduled: no  Notes to clinic:  Overdue for office visit  Review for refill   Requested Prescriptions  Pending Prescriptions Disp Refills   gabapentin (NEURONTIN) 300 MG capsule [Pharmacy Med Name: GABAPENTIN 300 MG CAPSULE] 90 capsule 0    Sig: TAKE 1 [place]     Neurology: Anticonvulsants - gabapentin Failed - 06/05/2019 10:05 AM      Failed - Valid encounter within last 12 months    Recent Outpatient Visits          6 months ago    Primary Care at [facility], Ines Bloomer, MD   1 year ago Essential hypertension   Primary Care at [facility], Gelene Mink, PA-C   1 year ago COPD exacerbation [facility])   Primary Care at [facility], Gelene Mink, PA-C   2 years ago Essential hypertension   Primary Care at [facility] [place] [facility], [place], [place]   2 years ago Pain in both feet   Primary Care at [facility] [place] [facility], [place], [place]

## 2019-06-11 ENCOUNTER — Telehealth: Payer: Self-pay | Admitting: *Deleted

## 2019-06-11 NOTE — Telephone Encounter (Signed)
Sent to scheduling pool.

## 2019-06-12 NOTE — Telephone Encounter (Signed)
Spoke with pt and scheduled appt °

## 2019-06-16 ENCOUNTER — Ambulatory Visit: Payer: Medicare Other | Admitting: Emergency Medicine

## 2019-06-17 ENCOUNTER — Ambulatory Visit: Payer: Medicare Other | Admitting: Emergency Medicine

## 2019-06-18 ENCOUNTER — Encounter: Payer: Self-pay | Admitting: Emergency Medicine

## 2019-09-16 ENCOUNTER — Telehealth: Payer: Self-pay | Admitting: *Deleted

## 2019-09-16 NOTE — Telephone Encounter (Signed)
Faxed Rx request for Albuterol Inhaler, denied patient needs an appt.for additional refill.

## 2019-10-09 ENCOUNTER — Telehealth: Payer: Self-pay | Admitting: *Deleted

## 2019-10-09 NOTE — Telephone Encounter (Signed)
Faxed request for albuterol (ProAir) inhaler, denied patient needs to schedule an appointment for refills.

## 2019-11-21 ENCOUNTER — Encounter: Payer: Self-pay | Admitting: Registered Nurse

## 2019-11-21 ENCOUNTER — Ambulatory Visit (INDEPENDENT_AMBULATORY_CARE_PROVIDER_SITE_OTHER): Payer: Medicare Other | Admitting: Registered Nurse

## 2019-11-21 ENCOUNTER — Other Ambulatory Visit: Payer: Self-pay

## 2019-11-21 VITALS — BP 151/99 | HR 68 | Temp 97.9°F | Resp 15 | Ht 74.0 in | Wt 206.6 lb

## 2019-11-21 DIAGNOSIS — Z130 Encounter for screening for diseases of the blood and blood-forming organs and certain disorders involving the immune mechanism: Secondary | ICD-10-CM | POA: Diagnosis not present

## 2019-11-21 DIAGNOSIS — G8929 Other chronic pain: Secondary | ICD-10-CM

## 2019-11-21 DIAGNOSIS — Z13228 Encounter for screening for other metabolic disorders: Secondary | ICD-10-CM

## 2019-11-21 DIAGNOSIS — M549 Dorsalgia, unspecified: Secondary | ICD-10-CM

## 2019-11-21 DIAGNOSIS — Z1211 Encounter for screening for malignant neoplasm of colon: Secondary | ICD-10-CM

## 2019-11-21 DIAGNOSIS — Z1329 Encounter for screening for other suspected endocrine disorder: Secondary | ICD-10-CM | POA: Diagnosis not present

## 2019-11-21 DIAGNOSIS — L84 Corns and callosities: Secondary | ICD-10-CM | POA: Diagnosis not present

## 2019-11-21 DIAGNOSIS — R059 Cough, unspecified: Secondary | ICD-10-CM | POA: Insufficient documentation

## 2019-11-21 DIAGNOSIS — Z13220 Encounter for screening for lipoid disorders: Secondary | ICD-10-CM | POA: Diagnosis not present

## 2019-11-21 DIAGNOSIS — Z1159 Encounter for screening for other viral diseases: Secondary | ICD-10-CM | POA: Diagnosis not present

## 2019-11-21 DIAGNOSIS — F5104 Psychophysiologic insomnia: Secondary | ICD-10-CM | POA: Diagnosis not present

## 2019-11-21 DIAGNOSIS — Z87891 Personal history of nicotine dependence: Secondary | ICD-10-CM

## 2019-11-21 DIAGNOSIS — I10 Essential (primary) hypertension: Secondary | ICD-10-CM

## 2019-11-21 DIAGNOSIS — F4323 Adjustment disorder with mixed anxiety and depressed mood: Secondary | ICD-10-CM

## 2019-11-21 DIAGNOSIS — R05 Cough: Secondary | ICD-10-CM

## 2019-11-21 DIAGNOSIS — Z23 Encounter for immunization: Secondary | ICD-10-CM

## 2019-11-21 MED ORDER — GABAPENTIN 100 MG PO CAPS: 100.0000 mg | ORAL_CAPSULE | Freq: Three times a day (TID) | ORAL | 4 refills | Status: DC

## 2019-11-21 MED ORDER — ESCITALOPRAM OXALATE 10 MG PO TABS: 10.0000 mg | ORAL_TABLET | Freq: Every day | ORAL | 0 refills | Status: DC

## 2019-11-21 MED ORDER — QUETIAPINE FUMARATE 100 MG PO TABS: 100.0000 mg | ORAL_TABLET | Freq: Every day | ORAL | 0 refills | Status: DC

## 2019-11-21 MED ORDER — AMLODIPINE BESYLATE 10 MG PO TABS: 10.0000 mg | ORAL_TABLET | Freq: Every day | ORAL | 3 refills | Status: DC

## 2019-11-21 MED ORDER — CLONAZEPAM 1 MG PO TABS: ORAL_TABLET | ORAL | 0 refills | Status: DC

## 2019-11-21 MED ORDER — ALBUTEROL SULFATE HFA 108 (90 BASE) MCG/ACT IN AERS: 2.0000 | INHALATION_SPRAY | Freq: Four times a day (QID) | RESPIRATORY_TRACT | 2 refills | Status: DC | PRN

## 2019-11-21 NOTE — Patient Instructions (Addendum)
Mr Angelino:   Medications: Amlodipine: start 10mg  by mouth every morning. This is for your blood pressure.  Seroquel: take 100mg  by mouth every night around bed time. This will help you sleep.  Escitalopram: take 10mg  by mouth every morning. It is important to take this medication every day. This can help with anxiety and depression.  Albuterol: take two puffs of your inhaler as needed for shortness of breath, chest tightness, or cough.  Gabapentin: take 100mg  (1 capsule) with breakfast and lunch. Take 300mg  (3 capsules) at dinner or between dinner and bed time.  Clonazepam (Klonopin): take 1mg  at bed time as needed to help with anxiety/ sleep. I'd prefer if you did not use this when you are using the seroquel, but if you need to, you can. Just take extra precautions as both of these medications are sedating.  Other orders from today:  Referral to Chest CT: this will check for lung cancer and abnormalities. They will let know if you have an infection or COPD exacerbation as well.  Referral to Podiatry: they will assess and treat the corns on your feet.  Referral to Gastroenterology: It looks like you're due for a colonoscopy - they will review this procedure, prep, and scheduling.   If you have lab work done today you will be contacted with your lab results within the next 2 weeks.  If you have not heard from then please contact . The fastest way to get your results is to register for My Chart.   IF you received an x-ray today, you will receive an invoice from [facility]. Please contact [facility] at [phone] with questions or concerns regarding your invoice.   IF you received labwork today, you will receive an invoice from [facility]. Please contact [facility] at [phone] with questions or concerns regarding your invoice.   Our billing staff will not be able to assist you with questions regarding bills from these companies.  You will be contacted with  the lab results as soon as they are available. The fastest way to get your results is to activate your My Chart account. Instructions are located on the last page of this paperwork. If you have not heard from [place] regarding the results in 2 weeks, please contact this office.

## 2019-11-21 NOTE — Progress Notes (Signed)
Established Patient Office Visit  Subjective:  Patient ID: Jerry Schulenburg., male    DOB: [birth year]  Age: 69 y.o. MRN: [id number]  CC:  Chief Complaint  Patient presents with  . Medication Refill    pt needs most of his prescription refills including amlodapine  . Depression    pt is losing his teeth and is struggling with insomnia, has tried trazadone,  OTC products, melatoin   . Referral    podietry for a corn or the like, with pain    HPI Jerry Spears. presents for med refills Has a number of concerns:  BP: has been running high. Denies CV symptoms. Had been on 5mg  amlodipine with good effect, hopes to restart amlodipine.   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 Denies si/hi. Willing to start medication. Has been using klonopin prn for sleep with good effect, but is aware of concerns regarding benzo use.  Corns on feeT: requesting podiatry referral  Losing teeth: no current infection, but interested in seeing dentistry to have teeth removed.  COPD: question of an exacerbation. Has been coughing more but has run out of his albuterol inhaler. Will refill this. Meets criteria for routine CT of chest to ro lung ca. Will order this as he's unsure of his last screening  Colonoscopy: due, will send referral  Tdap due: pt declined, acknowledges risks  Lab work: collected screenings, will follow up as warranted  Past Medical History:  Diagnosis Date  . Anxiety   . Chronic back pain   . DDD (degenerative disc disease), lumbar     History reviewed. No pertinent surgical history.  History reviewed. No pertinent family history.  Social History   Socioeconomic History  . Marital status: Single    Spouse name: Not on file  . Number of children: Not on file  . Years of education: Not on file  . Highest education level: Not on file  Occupational History  . Not on file  Tobacco Use  .  Smoking status: Current Every Day Smoker    Packs/day: 1.00    Types: Cigarettes  . Smokeless tobacco: Current User  Substance and Sexual Activity  . Alcohol use: Yes    Comment: occ  . Drug use: No  . Sexual activity: Yes    Birth control/protection: None  Other Topics Concern  . Not on file  Social History Narrative  . Not on file   Social Determinants of Health   Financial Resource Strain:   . Difficulty of Paying Living Expenses:   Food Insecurity:   . Worried About in the Last Year:   . Programme researcher, broadcasting/film/video in the Last Year:   Transportation Needs:   . Barista (Medical):   Freight forwarder Lack of Transportation (Non-Medical):   Physical Activity:   . Days of Exercise per Week:   . Minutes of Exercise per Session:   Stress:   . Feeling of Stress :   Social Connections:   . Frequency of Communication with Friends and Family:   . Frequency of Social Gatherings with Friends and Family:   . Attends Religious Services:   . Active Member of Clubs or Organizations:   . Attends [place] [employer] Meetings:   Banker Marital Status:   Intimate Partner Violence:   . Fear of Current or Ex-Partner:   . Emotionally Abused:   [place] Physically Abused:   .  Sexually Abused:     Outpatient Medications Prior to Visit  Medication Sig Dispense Refill  . albuterol (PROAIR HFA) 108 (90 Base) MCG/ACT inhaler INHALE 2 PUFFS EVERY 4 HOURS AS NEEDED FOR WHEEZING AND SHORTNESS OF BREATH 8.5 Inhaler 1  . amLODipine (NORVASC) 5 MG tablet Take 5 mg by mouth daily.    . clonazePAM (KLONOPIN) 1 MG tablet TAKE 1 TABLET BY MOUTH EVERY DAY AS NEEDED FOR ANXIETY 30 tablet 0  . gabapentin (NEURONTIN) 300 MG capsule TAKE 1 CAPSULE BY MOUTH THREE TIMES A DAY 90 capsule 0  . benzonatate (TESSALON) 200 MG capsule Take 1 capsule (200 mg total) by mouth every 8 (eight) hours. (Patient not taking: Reported on 11/21/2019) 21 capsule 0  . cloNIDine (CATAPRES) 0.1 MG tablet TAKE 1 TABLET (0.1 MG TOTAL)  BY MOUTH 2 (TWO) TIMES DAILY, NEED OFFICE VISIT (Patient not taking: Reported on 11/21/2019) 60 tablet 0  . ibuprofen (ADVIL,MOTRIN) 200 MG tablet Take 400 mg by mouth every 6 (six) hours as needed for headache (headache).    [place] ipratropium (ATROVENT) 0.06 % nasal spray Place 2 sprays into both nostrils 4 (four) times daily. (Patient not taking: Reported on 11/21/2019) 15 mL 12  . polyethylene glycol powder (GLYCOLAX/MIRALAX) powder Take 17 g by mouth 2 (two) times daily as needed. (Patient not taking: Reported on 11/21/2019) 578 g 1   No facility-administered medications prior to visit.    No Known Allergies  ROS Review of Systems  Constitutional: Negative.   HENT: Negative.   Eyes: Negative.   Respiratory: Positive for cough and shortness of breath. Negative for apnea, choking, chest tightness, wheezing and stridor.   Cardiovascular: Negative.   Gastrointestinal: Negative.   Endocrine: Negative.   Genitourinary: Negative.   Musculoskeletal: Negative.   Skin: Negative.   Allergic/Immunologic: Negative.   Neurological: Negative.   Hematological: Negative.   Psychiatric/Behavioral: Positive for dysphoric mood and sleep disturbance. Negative for agitation, behavioral problems, confusion, decreased concentration, hallucinations, self-injury and suicidal ideas. The patient is nervous/anxious. The patient is not hyperactive.   All other systems reviewed and are negative.     Objective:    Physical Exam  Constitutional: He is oriented to person, place, and time. He appears well-developed and well-nourished. No distress.  Cardiovascular: Normal rate, regular rhythm and normal heart sounds. Exam reveals no gallop and no friction rub.  No murmur heard. Pulmonary/Chest: Effort normal. No respiratory distress. He has wheezes. He has no rales. He exhibits no tenderness.  Neurological: He is alert and oriented to person, place, and time.  Skin: Skin is warm and dry. No rash noted. He is not  diaphoretic. No erythema. No pallor.  Psychiatric: His speech is normal and behavior is normal. Judgment and thought content normal. His mood appears anxious. His affect is not angry, not blunt, not labile and not inappropriate. Cognition and memory are normal. He exhibits a depressed mood.  Nursing note and vitals reviewed.   BP (!) 151/99   Pulse 68   Temp 97.9 F (36.6 C) (Temporal)   Resp 15   Ht 6\' 2"  (1.88 m)   Wt 206 lb 9.6 oz (93.7 kg)   SpO2 98%   BMI 26.53 kg/m  Wt Readings from Last 3 Encounters:  11/21/19 206 lb 9.6 oz (93.7 kg)  03/20/18 200 lb 3.2 oz (90.8 kg)  03/01/18 200 lb (90.7 kg)     Health Maintenance Due  Topic Date Due  . Hepatitis C Screening  Never done  There are no preventive care reminders to display for this patient.  Lab Results  Component Value Date   TSH 2.110 07/21/2016   Lab Results  Component Value Date   WBC 6.1 11/08/2017   HGB 14.0 11/08/2017   HCT 41.4 11/08/2017   MCV 97.0 11/08/2017   PLT 242 11/08/2017   Lab Results  Component Value Date   NA 141 11/08/2017   K 4.3 11/08/2017   CO2 28 11/08/2017   GLUCOSE 97 11/08/2017   BUN 16 11/08/2017   CREATININE 1.16 11/08/2017   BILITOT 0.8 11/08/2017   ALKPHOS 82 11/08/2017   AST 24 11/08/2017   ALT 15 (L) 11/08/2017   PROT 8.3 (H) 11/08/2017   ALBUMIN 4.0 11/08/2017   CALCIUM 9.1 11/08/2017   ANIONGAP 8 11/08/2017   No results found for: CHOL No results found for: HDL No results found for: LDLCALC No results found for: TRIG No results found for: CHOLHDL No results found for: WCHE5I    Assessment & Plan:   Problem List Items Addressed This Visit      Cardiovascular and Mediastinum   Essential hypertension   Relevant Medications   amLODipine (NORVASC) 10 MG tablet     Musculoskeletal and Integument   Corn of foot - Primary   Relevant Orders   Ambulatory referral to Podiatry     Other   Psychophysiological insomnia   Adjustment disorder with mixed  anxiety and depressed mood   Relevant Medications   QUEtiapine (SEROQUEL) 100 MG tablet   clonazePAM (KLONOPIN) 1 MG tablet   escitalopram (LEXAPRO) 10 MG tablet   Cough   Relevant Medications   albuterol (VENTOLIN HFA) 108 (90 Base) MCG/ACT inhaler   Chronic back pain   Relevant Medications   clonazePAM (KLONOPIN) 1 MG tablet   escitalopram (LEXAPRO) 10 MG tablet   gabapentin (NEURONTIN) 100 MG capsule    Other Visit Diagnoses    Screening for viral disease       Relevant Orders   Hepatitis C antibody   Screening for endocrine, metabolic and immunity disorder       Relevant Orders   TSH   CBC with Differential   Comprehensive metabolic panel   Hemoglobin A1c   Lipid screening       Relevant Orders   Lipid panel   Special screening for malignant neoplasms, colon       Relevant Orders   Ambulatory referral to Gastroenterology   Need for diphtheria-tetanus-pertussis (Tdap) vaccine       History of smoking greater than 50 pack years       Relevant Orders   CT CHEST LUNG CA SCREEN LOW DOSE W/O CM      Meds ordered this encounter  Medications  . DISCONTD: QUEtiapine (SEROQUEL) 100 MG tablet    Sig: Take 1 tablet (100 mg total) by mouth at bedtime.    Dispense:  90 tablet    Refill:  0    Order Specific Question:   Supervising Provider    Answer:   Collie Siad A [id number]  . albuterol (VENTOLIN HFA) 108 (90 Base) MCG/ACT inhaler    Sig: Inhale 2 puffs into the lungs every 6 (six) hours as needed for wheezing or shortness of breath.    Dispense:  18 g    Refill:  2    Order Specific Question:   Supervising Provider    Answer:   Collie Siad A [id number]  . QUEtiapine (SEROQUEL) 100 MG tablet  Sig: Take 1 tablet (100 mg total) by mouth at bedtime.    Dispense:  90 tablet    Refill:  0    Order Specific Question:   Supervising Provider    Answer:   Delia Chimes A [id number]  . clonazePAM (KLONOPIN) 1 MG tablet    Sig: TAKE 1 TABLET BY MOUTH EVERY DAY AS NEEDED  FOR ANXIETY    Dispense:  30 tablet    Refill:  0    Order Specific Question:   Supervising Provider    Answer:   Delia Chimes A [id number]  . escitalopram (LEXAPRO) 10 MG tablet    Sig: Take 1 tablet (10 mg total) by mouth daily.    Dispense:  90 tablet    Refill:  0    Order Specific Question:   Supervising Provider    Answer:   Delia Chimes A [id number]  . amLODipine (NORVASC) 10 MG tablet    Sig: Take 1 tablet (10 mg total) by mouth daily.    Dispense:  90 tablet    Refill:  3    Order Specific Question:   Supervising Provider    Answer:   Delia Chimes A [id number]  . gabapentin (NEURONTIN) 100 MG capsule    Sig: Take 1 capsule (100 mg total) by mouth 3 (three) times daily.    Dispense:  180 capsule    Refill:  4    Order Specific Question:   Supervising Provider    Answer:   Forrest Moron [id number]    Follow-up: Return in about 6 weeks (around 01/02/2020) for lexapro and seroquel follow up.   PLAN  Labs collected, will follow up as warranted  Increase amlodipine to 10mg  Po qd. Take daily  Start escitalopram 10mg  PO qd for depression with anxiety, seroquel 100mg  PO qhs for sleep  Refill klonopin 1mg  PO qhs PRN for sleep  Refill albuterol inhaler, order chest CT  Ref send to GI for colonoscopy  Ref sent to podiatry for corns  Refill gabapentin, suggest limited daytime dosing to avoid sedation but still provide relief. Discussed variable dosing of this medication  He will follow up in 4-6 weeks for med check  Patient encouraged to call clinic with any questions, comments, or concerns.  I spent 45 minutes with this patient, more than 50% of which was spent counseling/educating.  Maximiano Coss, NP

## 2019-11-22 LAB — COMPREHENSIVE METABOLIC PANEL
ALT: 12 IU/L (ref 0–44)
AST: 22 IU/L (ref 0–40)
Albumin/Globulin Ratio: 1.2 (ref 1.2–2.2)
Albumin: 4 g/dL (ref 3.8–4.8)
Alkaline Phosphatase: 94 IU/L (ref 39–117)
BUN/Creatinine Ratio: 12 (ref 10–24)
BUN: 15 mg/dL (ref 8–27)
Bilirubin Total: 0.4 mg/dL (ref 0.0–1.2)
CO2: 22 mmol/L (ref 20–29)
Calcium: 9 mg/dL (ref 8.6–10.2)
Chloride: 105 mmol/L (ref 96–106)
Creatinine, Ser: 1.24 mg/dL (ref 0.76–1.27)
GFR calc Af Amer: 68 mL/min/{1.73_m2} (ref >59)
GFR calc non Af Amer: 59 mL/min/{1.73_m2} — ABNORMAL LOW (ref >59)
Globulin, Total: 3.4 g/dL (ref 1.5–4.5)
Glucose: 97 mg/dL (ref 65–99)
Potassium: 5.1 mmol/L (ref 3.5–5.2)
Sodium: 139 mmol/L (ref 134–144)
Total Protein: 7.4 g/dL (ref 6.0–8.5)

## 2019-11-22 LAB — CBC WITH DIFFERENTIAL/PLATELET
Basophils Absolute: 0 10*3/uL (ref 0.0–0.2)
Basos: 1 %
EOS (ABSOLUTE): 0.1 10*3/uL (ref 0.0–0.4)
Eos: 2 %
Hematocrit: 41 % (ref 37.5–51.0)
Hemoglobin: 14 g/dL (ref 13.0–17.7)
Immature Grans (Abs): 0 10*3/uL (ref 0.0–0.1)
Immature Granulocytes: 0 %
Lymphocytes Absolute: 1.8 10*3/uL (ref 0.7–3.1)
Lymphs: 32 %
MCH: 32.4 pg (ref 26.6–33.0)
MCHC: 34.1 g/dL (ref 31.5–35.7)
MCV: 95 fL (ref 79–97)
Monocytes Absolute: 0.4 10*3/uL (ref 0.1–0.9)
Monocytes: 7 %
Neutrophils Absolute: 3.2 10*3/uL (ref 1.4–7.0)
Neutrophils: 58 %
Platelets: 250 10*3/uL (ref 150–450)
RBC: 4.32 x10E6/uL (ref 4.14–5.80)
RDW: 12.6 % (ref 11.6–15.4)
WBC: 5.5 10*3/uL (ref 3.4–10.8)

## 2019-11-22 LAB — HEPATITIS C ANTIBODY: Hep C Virus Ab: <0.1 s/co ratio (ref 0.0–0.9)

## 2019-11-22 LAB — LIPID PANEL
Chol/HDL Ratio: 3.7 ratio (ref 0.0–5.0)
Cholesterol, Total: 157 mg/dL (ref 100–199)
HDL: 43 mg/dL (ref >39)
LDL Chol Calc (NIH): 97 mg/dL (ref 0–99)
Triglycerides: 92 mg/dL (ref 0–149)
VLDL Cholesterol Cal: 17 mg/dL (ref 5–40)

## 2019-11-22 LAB — HEMOGLOBIN A1C
Est. average glucose Bld gHb Est-mCnc: 120 mg/dL
Hgb A1c MFr Bld: 5.8 % — ABNORMAL HIGH (ref 4.8–5.6)

## 2019-11-22 LAB — TSH: TSH: 2.99 u[IU]/mL (ref 0.450–4.500)

## 2019-11-25 ENCOUNTER — Encounter: Payer: Self-pay | Admitting: Radiology

## 2019-11-25 NOTE — Progress Notes (Signed)
Good afternoon,  If we could get a normal results letter to Mr. Christianson, that would be excellent.  Thank you!  Jari Sportsman, NP

## 2020-01-02 ENCOUNTER — Other Ambulatory Visit: Payer: Self-pay | Admitting: Registered Nurse

## 2020-01-02 ENCOUNTER — Encounter: Payer: Self-pay | Admitting: Registered Nurse

## 2020-01-02 ENCOUNTER — Ambulatory Visit: Payer: Medicare Other | Admitting: Podiatry

## 2020-01-02 ENCOUNTER — Other Ambulatory Visit: Payer: Self-pay

## 2020-01-02 ENCOUNTER — Ambulatory Visit (INDEPENDENT_AMBULATORY_CARE_PROVIDER_SITE_OTHER): Payer: Medicare Other | Admitting: Registered Nurse

## 2020-01-02 DIAGNOSIS — F4323 Adjustment disorder with mixed anxiety and depressed mood: Secondary | ICD-10-CM

## 2020-01-02 MED ORDER — QUETIAPINE FUMARATE 100 MG PO TABS: 100.0000 mg | ORAL_TABLET | Freq: Every day | ORAL | 3 refills | Status: DC

## 2020-01-02 MED ORDER — ESCITALOPRAM OXALATE 10 MG PO TABS: 10.0000 mg | ORAL_TABLET | Freq: Every day | ORAL | 3 refills | Status: DC

## 2020-01-02 NOTE — Telephone Encounter (Signed)
Patient is requesting a refill of the following medications: Requested Prescriptions   Pending Prescriptions Disp Refills   clonazePAM (KLONOPIN) 1 MG tablet [Pharmacy Med Name: CLONAZEPAM 1 MG TABLET] 30 tablet 0    Sig: TAKE 1 TABLET BY MOUTH EVERY DAY AS NEEDED FOR ANXIETY    Date of patient request: 01/02/2020 Last office visit: 01/02/2020 Date of last refill: 11/21/2019 Last refill amount: 30 tab

## 2020-01-02 NOTE — Patient Instructions (Signed)
° ° ° °  If you have lab work done today you will be contacted with your lab results within the next 2 weeks.  If you have not heard from us then please contact us. The fastest way to get your results is to register for My Chart. ° ° °IF you received an x-ray today, you will receive an invoice from [facility]. Please contact [facility] at [phone] with questions or concerns regarding your invoice.  ° °IF you received labwork today, you will receive an invoice from [facility]. Please contact [facility] at [phone] with questions or concerns regarding your invoice.  ° °Our billing staff will not be able to assist you with questions regarding bills from these companies. ° °You will be contacted with the lab results as soon as they are available. The fastest way to get your results is to activate your My Chart account. Instructions are located on the last page of this paperwork. If you have not heard from us regarding the results in 2 weeks, please contact this office. °  ° ° ° °

## 2020-01-05 ENCOUNTER — Encounter: Payer: Self-pay | Admitting: Registered Nurse

## 2020-01-05 NOTE — Progress Notes (Signed)
Established Patient Office Visit  Subjective:  Patient ID: Jerry Berthold., male    DOB: 07/21/1951  Age: 69 y.o. MRN: [id number]  CC:  Chief Complaint  Patient presents with  . Follow-up    6 week follow up on medication Lexapro and Seroquel. Patient statets he is doing well and no other questions or concerns   . Medication Refill    Klonopin    HPI Jerry Spears. presents for follow up for med check - recently started lexapro and seroquel for anxiety and insomnia.  States he is doing well. Anxiety much improved, sleep getting better. No concerns regarding AEs.  Also notes that his podiatry appt is this afternoon. He is looking forward to having his corns addressed - in the past, this brought him much relief. Following this, he is overdue for dental work - but notes that he is feeling more strongly about getting this done with his anxiety under better control.  Lastly, need refill on klonopin. Has been using less since lexapro and seroquel. Acknowledges risks of taking this medication.   No further concerns.   Past Medical History:  Diagnosis Date  . Anxiety   . Chronic back pain   . DDD (degenerative disc disease), lumbar     No past surgical history on file.  No family history on file.  Social History   Socioeconomic History  . Marital status: Single    Spouse name: Not on file  . Number of children: Not on file  . Years of education: Not on file  . Highest education level: Not on file  Occupational History  . Not on file  Tobacco Use  . Smoking status: Current Every Day Smoker    Packs/day: 1.00    Types: Cigarettes  . Smokeless tobacco: Current User  Substance and Sexual Activity  . Alcohol use: Yes    Comment: occ  . Drug use: No  . Sexual activity: Yes    Birth control/protection: None  Other Topics Concern  . Not on file  Social History Narrative  . Not on file   Social Determinants of Health   Financial Resource Strain:   .  Difficulty of Paying Living Expenses:   Food Insecurity:   . Worried About Programme researcher, broadcasting/film/video in the Last Year:   . Barista in the Last Year:   Transportation Needs:   . Freight forwarder (Medical):   Marland Kitchen Lack of Transportation (Non-Medical):   Physical Activity:   . Days of Exercise per Week:   . Minutes of Exercise per Session:   Stress:   . Feeling of Stress :   Social Connections:   . Frequency of Communication with Friends and Family:   . Frequency of Social Gatherings with Friends and Family:   . Attends Religious Services:   . Active Member of Clubs or Organizations:   . Attends Banker Meetings:   Marland Kitchen Marital Status:   Intimate Partner Violence:   . Fear of Current or Ex-Partner:   . Emotionally Abused:   Marland Kitchen Physically Abused:   . Sexually Abused:     Outpatient Medications Prior to Visit  Medication Sig Dispense Refill  . albuterol (VENTOLIN HFA) 108 (90 Base) MCG/ACT inhaler Inhale 2 puffs into the lungs every 6 (six) hours as needed for wheezing or shortness of breath. 18 g 2  . amLODipine (NORVASC) 10 MG tablet Take 1 tablet (10 mg total) by mouth daily. 90  tablet 3  . gabapentin (NEURONTIN) 100 MG capsule Take 1 capsule (100 mg total) by mouth 3 (three) times daily. 180 capsule 4  . clonazePAM (KLONOPIN) 1 MG tablet TAKE 1 TABLET BY MOUTH EVERY DAY AS NEEDED FOR ANXIETY 30 tablet 0  . escitalopram (LEXAPRO) 10 MG tablet Take 1 tablet (10 mg total) by mouth daily. 90 tablet 0  . QUEtiapine (SEROQUEL) 100 MG tablet Take 1 tablet (100 mg total) by mouth at bedtime. 90 tablet 0   No facility-administered medications prior to visit.    No Known Allergies  ROS Review of Systems  Constitutional: Negative.   HENT: Negative.   Eyes: Negative.   Respiratory: Negative.   Cardiovascular: Negative.   Gastrointestinal: Negative.   Endocrine: Negative.   Genitourinary: Negative.   Musculoskeletal: Negative.   Skin: Negative.     Allergic/Immunologic: Negative.   Neurological: Negative.   Hematological: Negative.   Psychiatric/Behavioral: Negative.   All other systems reviewed and are negative.     Objective:    Physical Exam  Constitutional: He is oriented to person, place, and time. He appears well-developed and well-nourished. No distress.  Cardiovascular: Normal rate and regular rhythm.  Pulmonary/Chest: Effort normal. No respiratory distress.  Neurological: He is alert and oriented to person, place, and time.  Skin: Skin is warm and dry. No rash noted. He is not diaphoretic. No erythema. No pallor.  Psychiatric: He has a normal mood and affect. His behavior is normal. Judgment and thought content normal.  Nursing note and vitals reviewed.   BP (!) 158/74   Pulse 65   Temp (!) 97.5 F (36.4 C) (Temporal)   Resp 17   Ht 6\' 2"  (1.88 m)   Wt 205 lb (93 kg)   SpO2 97%   BMI 26.32 kg/m  Wt Readings from Last 3 Encounters:  01/02/20 205 lb (93 kg)  11/21/19 206 lb 9.6 oz (93.7 kg)  03/20/18 200 lb 3.2 oz (90.8 kg)     Health Maintenance Due  Topic Date Due  . COVID-19 Vaccine (1) Never done    There are no preventive care reminders to display for this patient.  Lab Results  Component Value Date   TSH 2.990 11/21/2019   Lab Results  Component Value Date   WBC 5.5 11/21/2019   HGB 14.0 11/21/2019   HCT 41.0 11/21/2019   MCV 95 11/21/2019   PLT 250 11/21/2019   Lab Results  Component Value Date   NA 139 11/21/2019   K 5.1 11/21/2019   CO2 22 11/21/2019   GLUCOSE 97 11/21/2019   BUN 15 11/21/2019   CREATININE 1.24 11/21/2019   BILITOT 0.4 11/21/2019   ALKPHOS 94 11/21/2019   AST 22 11/21/2019   ALT 12 11/21/2019   PROT 7.4 11/21/2019   ALBUMIN 4.0 11/21/2019   CALCIUM 9.0 11/21/2019   ANIONGAP 8 11/08/2017   Lab Results  Component Value Date   CHOL 157 11/21/2019   Lab Results  Component Value Date   HDL 43 11/21/2019   Lab Results  Component Value Date   LDLCALC  97 11/21/2019   Lab Results  Component Value Date   TRIG 92 11/21/2019   Lab Results  Component Value Date   CHOLHDL 3.7 11/21/2019   Lab Results  Component Value Date   HGBA1C 5.8 (H) 11/21/2019      Assessment & Plan:   Problem List Items Addressed This Visit      Other   Adjustment disorder with  mixed anxiety and depressed mood   Relevant Medications   escitalopram (LEXAPRO) 10 MG tablet   QUEtiapine (SEROQUEL) 100 MG tablet      Meds ordered this encounter  Medications  . escitalopram (LEXAPRO) 10 MG tablet    Sig: Take 1 tablet (10 mg total) by mouth daily.    Dispense:  90 tablet    Refill:  3    Order Specific Question:   Supervising Provider    Answer:   Collie Siad A [id number]  . QUEtiapine (SEROQUEL) 100 MG tablet    Sig: Take 1 tablet (100 mg total) by mouth at bedtime.    Dispense:  90 tablet    Refill:  3    Order Specific Question:   Supervising Provider    Answer:   Doristine Bosworth [id number]    Follow-up: No follow-ups on file.   PLAN  Continue lexapro and seroquel. Follow up in 1 year or PRN  Klonopin daily prn - ideally cessation within 1-2 months  Patient encouraged to call clinic with any questions, comments, or concerns.  Janeece Agee, NP

## 2020-01-23 ENCOUNTER — Other Ambulatory Visit: Payer: Self-pay | Admitting: Registered Nurse

## 2020-01-23 DIAGNOSIS — F4323 Adjustment disorder with mixed anxiety and depressed mood: Secondary | ICD-10-CM

## 2020-01-23 MED ORDER — CLONAZEPAM 1 MG PO TABS: ORAL_TABLET | ORAL | 0 refills | Status: DC

## 2020-01-23 NOTE — Telephone Encounter (Signed)
Pt came in and is needing a refill on   clonazePAM (KLONOPIN) 1 MG tablet [[id number]]   Pt would like a call when this is sent into the Pharmacy. Please advise.

## 2020-03-03 ENCOUNTER — Other Ambulatory Visit: Payer: Self-pay | Admitting: Registered Nurse

## 2020-03-03 DIAGNOSIS — F4323 Adjustment disorder with mixed anxiety and depressed mood: Secondary | ICD-10-CM

## 2020-03-04 NOTE — Telephone Encounter (Signed)
Patient is requesting a refill of the following medications: Requested Prescriptions   Pending Prescriptions Disp Refills   clonazePAM (KLONOPIN) 1 MG tablet [Pharmacy Med Name: CLONAZEPAM 1 MG TABLET] 30 tablet 0    Sig: TAKE 1 TABLET BY MOUTH EVERY DAY AS NEEDED FOR ANXIETY    Date of patient request: 03/03/20 Last office visit: 01/02/20 Date of last refill: 01/23/20 Last refill amount: 30 Follow up time period per chart:none

## 2020-04-28 ENCOUNTER — Ambulatory Visit: Payer: Medicare Other | Admitting: Podiatry

## 2020-04-28 ENCOUNTER — Other Ambulatory Visit: Payer: Self-pay

## 2020-04-28 DIAGNOSIS — L989 Disorder of the skin and subcutaneous tissue, unspecified: Secondary | ICD-10-CM | POA: Diagnosis not present

## 2020-04-28 DIAGNOSIS — R600 Localized edema: Secondary | ICD-10-CM

## 2020-04-28 NOTE — Progress Notes (Signed)
   Subjective: 69 y.o. male presenting to the office today for evaluation of left foot pain this been going on for several months now.  Patient attributes it to a painful corn to the left foot.  He also states that over the last month or so he has had additional swelling to the bilateral feet.  The swelling is increased and very symptomatic.  He does have history of hypertension and is on a blood pressure medication   Past Medical History:  Diagnosis Date  . Anxiety   . Chronic back pain   . DDD (degenerative disc disease), lumbar      Objective:  Physical Exam General: Alert and oriented x3 in no acute distress  Dermatology: Hyperkeratotic lesion(s) present on the plantar aspect of the fifth MTPJ left foot. Pain on palpation with a central nucleated core noted. Skin is warm, dry and supple bilateral lower extremities. Negative for open lesions or macerations.  Vascular: Palpable pedal pulses bilaterally.  Bilateral lower extremity edema noted.  No erythema noted.  Capillary refill within normal limits.  Neurological: Epicritic and protective threshold grossly intact bilaterally.   Musculoskeletal Exam: Pain on palpation at the keratotic lesion(s) noted. Range of motion within normal limits bilateral. Muscle strength 5/5 in all groups bilateral.  Assessment: 1.  Porokeratosis/benign skin lesion left fifth MTPJ   Plan of Care:  1. Patient evaluated 2. Excisional debridement of keratoic lesion(s) using a chisel blade was performed without incident.  3.  Recommend OTC corn and callus remover. Salinocaine and dressing applied. 4.  Recommend close follow-up with PCP for the increased bilateral lower extremity edema.  Explained the bilateral lower extremity edema can come from several different pathologies.   5.  Patient is to return to the clinic PRN.   Felecia Shelling, DPM [facility]  Dr. Felecia Shelling, DPM    [address]                                         [place], [place] [address]                Office [phone]  Fax [phone]

## 2020-05-05 ENCOUNTER — Other Ambulatory Visit: Payer: Self-pay | Admitting: Registered Nurse

## 2020-05-05 DIAGNOSIS — F4323 Adjustment disorder with mixed anxiety and depressed mood: Secondary | ICD-10-CM

## 2020-05-05 NOTE — Telephone Encounter (Signed)
Patient is requesting a refill of the following medications: Requested Prescriptions   Pending Prescriptions Disp Refills   clonazePAM (KLONOPIN) 1 MG tablet [Pharmacy Med Name: CLONAZEPAM 1 MG TABLET] 30 tablet 0    Sig: TAKE 1 TABLET BY MOUTH EVERY DAY AS NEEDED FOR ANXIETY    Date of patient request: 05/05/20 Last office visit: 11/21/19 Date of last refill: 03/08/20 Last refill amount: 30 Follow up time period per chart:

## 2020-05-28 ENCOUNTER — Other Ambulatory Visit: Payer: Self-pay | Admitting: Registered Nurse

## 2020-05-28 DIAGNOSIS — R059 Cough, unspecified: Secondary | ICD-10-CM

## 2020-06-22 ENCOUNTER — Telehealth: Payer: Self-pay | Admitting: Registered Nurse

## 2020-06-22 ENCOUNTER — Other Ambulatory Visit: Payer: Self-pay | Admitting: Registered Nurse

## 2020-06-22 DIAGNOSIS — F4323 Adjustment disorder with mixed anxiety and depressed mood: Secondary | ICD-10-CM

## 2020-06-22 MED ORDER — CLONAZEPAM 1 MG PO TABS: ORAL_TABLET | ORAL | 0 refills | Status: DC

## 2020-06-22 NOTE — Telephone Encounter (Signed)
Duplicate request

## 2020-06-22 NOTE — Telephone Encounter (Signed)
Medication Refill - Medication: clonazepam   Has the patient contacted their pharmacy? Yes.   (Agent: If no, request that the patient contact the pharmacy for the refill.) (Agent: If yes, when and what did the pharmacy advise?)  Preferred Pharmacy (with phone number or street name):  [id number] [place], [place] - [address]  [address] [place] [address]  Phone: [phone] Fax: [phone]  Hours: Not open 24 hours     Agent: Please be advised that RX refills may take up to 3 business days. We ask that you follow-up with your pharmacy.

## 2020-06-22 NOTE — Telephone Encounter (Signed)
Requested medication (s) are due for refill today: yes   Requested medication (s) are on the active medication list: yes   Last refill: 05/11/2020  Future visit scheduled:no  Notes to clinic:  this refill cannot be delegated    Requested Prescriptions  Pending Prescriptions Disp Refills   clonazePAM (KLONOPIN) 1 MG tablet 30 tablet 0    Sig: TAKE 1 TABLET BY MOUTH EVERY DAY AS NEEDED FOR ANXIETY      Not Delegated - Psychiatry:  Anxiolytics/Hypnotics Failed - 06/22/2020 10:24 AM      Failed - This refill cannot be delegated      Failed - Urine Drug Screen completed in last 360 days      Passed - Valid encounter within last 6 months    Recent Outpatient Visits           5 months ago Adjustment disorder with mixed anxiety and depressed mood   [facility], Gerlene Burdock, NP   7 months ago Corn of foot   [facility], Gerlene Burdock, NP   1 year ago    [facility] at [facility], Eilleen Kempf, MD   2 years ago Essential hypertension   Primary Care at [facility], Madelaine Bhat, PA-C   2 years ago COPD exacerbation [facility]   Primary Care at [facility], Madelaine Bhat, [place]

## 2020-06-22 NOTE — Telephone Encounter (Signed)
Patient is requesting a refill of the following medications: Requested Prescriptions   Pending Prescriptions Disp Refills   clonazePAM (KLONOPIN) 1 MG tablet 30 tablet 0    Sig: TAKE 1 TABLET BY MOUTH EVERY DAY AS NEEDED FOR ANXIETY    Date of patient request: 06/22/20 Last office visit: 01/02/20 Date of last refill: 05/11/20 Last refill amount: 30 Follow up time period per chart:

## 2020-07-06 ENCOUNTER — Other Ambulatory Visit: Payer: Self-pay

## 2020-07-06 ENCOUNTER — Encounter: Payer: Self-pay | Admitting: Registered Nurse

## 2020-07-06 ENCOUNTER — Ambulatory Visit (INDEPENDENT_AMBULATORY_CARE_PROVIDER_SITE_OTHER): Payer: Medicare Other

## 2020-07-06 ENCOUNTER — Ambulatory Visit (INDEPENDENT_AMBULATORY_CARE_PROVIDER_SITE_OTHER): Payer: Medicare Other | Admitting: Registered Nurse

## 2020-07-06 VITALS — BP 144/74 | HR 65 | Temp 98.3°F | Resp 18 | Ht 74.0 in | Wt 217.8 lb

## 2020-07-06 DIAGNOSIS — R7303 Prediabetes: Secondary | ICD-10-CM | POA: Diagnosis not present

## 2020-07-06 DIAGNOSIS — R609 Edema, unspecified: Secondary | ICD-10-CM

## 2020-07-06 DIAGNOSIS — R0600 Dyspnea, unspecified: Secondary | ICD-10-CM | POA: Diagnosis not present

## 2020-07-06 DIAGNOSIS — R059 Cough, unspecified: Secondary | ICD-10-CM

## 2020-07-06 DIAGNOSIS — R0609 Other forms of dyspnea: Secondary | ICD-10-CM

## 2020-07-06 DIAGNOSIS — I4510 Unspecified right bundle-branch block: Secondary | ICD-10-CM

## 2020-07-06 MED ORDER — FUROSEMIDE 20 MG PO TABS: 20.0000 mg | ORAL_TABLET | Freq: Every day | ORAL | 3 refills | Status: DC

## 2020-07-06 NOTE — Patient Instructions (Signed)
° ° ° °  If you have lab work done today you will be contacted with your lab results within the next 2 weeks.  If you have not heard from us then please contact us. The fastest way to get your results is to register for My Chart. ° ° °IF you received an x-ray today, you will receive an invoice from [facility]. Please contact [facility] at [phone] with questions or concerns regarding your invoice.  ° °IF you received labwork today, you will receive an invoice from [facility]. Please contact [facility] at [phone] with questions or concerns regarding your invoice.  ° °Our billing staff will not be able to assist you with questions regarding bills from these companies. ° °You will be contacted with the lab results as soon as they are available. The fastest way to get your results is to activate your My Chart account. Instructions are located on the last page of this paperwork. If you have not heard from us regarding the results in 2 weeks, please contact this office. °  ° ° ° °

## 2020-07-07 LAB — COMPREHENSIVE METABOLIC PANEL
ALT: 13 IU/L (ref 0–44)
AST: 27 IU/L (ref 0–40)
Albumin/Globulin Ratio: 1.1 — ABNORMAL LOW (ref 1.2–2.2)
Albumin: 4.2 g/dL (ref 3.8–4.8)
Alkaline Phosphatase: 91 IU/L (ref 44–121)
BUN/Creatinine Ratio: 15 (ref 10–24)
BUN: 20 mg/dL (ref 8–27)
Bilirubin Total: 0.3 mg/dL (ref 0.0–1.2)
CO2: 22 mmol/L (ref 20–29)
Calcium: 9 mg/dL (ref 8.6–10.2)
Chloride: 105 mmol/L (ref 96–106)
Creatinine, Ser: 1.3 mg/dL — ABNORMAL HIGH (ref 0.76–1.27)
GFR calc Af Amer: 64 mL/min/{1.73_m2} (ref >59)
GFR calc non Af Amer: 56 mL/min/{1.73_m2} — ABNORMAL LOW (ref >59)
Globulin, Total: 4 g/dL (ref 1.5–4.5)
Glucose: 75 mg/dL (ref 65–99)
Potassium: 4.4 mmol/L (ref 3.5–5.2)
Sodium: 141 mmol/L (ref 134–144)
Total Protein: 8.2 g/dL (ref 6.0–8.5)

## 2020-07-07 LAB — URINALYSIS
Bilirubin, UA: NEGATIVE
Glucose, UA: NEGATIVE
Ketones, UA: NEGATIVE
Leukocytes,UA: NEGATIVE
Nitrite, UA: NEGATIVE
RBC, UA: NEGATIVE
Specific Gravity, UA: 1.028 (ref 1.005–1.030)
Urobilinogen, Ur: 0.2 mg/dL (ref 0.2–1.0)
pH, UA: 5 (ref 5.0–7.5)

## 2020-07-07 LAB — BRAIN NATRIURETIC PEPTIDE: BNP: 152.4 pg/mL — ABNORMAL HIGH (ref 0.0–100.0)

## 2020-07-07 LAB — HEMOGLOBIN A1C
Est. average glucose Bld gHb Est-mCnc: 128 mg/dL
Hgb A1c MFr Bld: 6.1 % — ABNORMAL HIGH (ref 4.8–5.6)

## 2020-07-07 LAB — CBC
Hematocrit: 38.5 % (ref 37.5–51.0)
Hemoglobin: 12.8 g/dL — ABNORMAL LOW (ref 13.0–17.7)
MCH: 31.3 pg (ref 26.6–33.0)
MCHC: 33.2 g/dL (ref 31.5–35.7)
MCV: 94 fL (ref 79–97)
Platelets: 268 10*3/uL (ref 150–450)
RBC: 4.09 x10E6/uL — ABNORMAL LOW (ref 4.14–5.80)
RDW: 12.6 % (ref 11.6–15.4)
WBC: 6.2 10*3/uL (ref 3.4–10.8)

## 2020-07-15 ENCOUNTER — Encounter: Payer: Self-pay | Admitting: Registered Nurse

## 2020-07-15 NOTE — Progress Notes (Signed)
Established Patient Office Visit  Subjective:  Patient ID: Jerry Glomski., male    DOB: 11/23/1950  Age: 68 y.o. MRN: [id number]  CC:  Chief Complaint  Patient presents with  . Foot Swelling    Patient states he has been having some swelling in both legs an feet for about 3-4 weeks. Per patient its painful to walk and sometimes keeping them up helped but hes scared they might get too stiff  . Urinary Frequency    Patient states he is having some urine frequency and not being able cintrol his urine.    HPI Jerry Spears. presents for new edema and frequent urination  Edema : BLE, uncomfortable but not painful. Worse over the day, improves with elevation. Has used compression stockings in the past with some relief, does not have any currently. Denies claudication, shob, chest pain, palpitations. Unsure of DOE - notes that he has felt that way but unsure if it's deconditioning.  Frequent urination: some frequency, some urgency, feeling like stream is hard to control. Much nocturia. No pain or gross hematuria. Denies sti exposure. No testicular pain no rash or visible lesions. No changes to bm  Past Medical History:  Diagnosis Date  . Anxiety   . Chronic back pain   . DDD (degenerative disc disease), lumbar     No past surgical history on file.  No family history on file.  Social History   Socioeconomic History  . Marital status: Single    Spouse name: Not on file  . Number of children: Not on file  . Years of education: Not on file  . Highest education level: Not on file  Occupational History  . Not on file  Tobacco Use  . Smoking status: Current Every Day Smoker    Packs/day: 1.00    Types: Cigarettes  . Smokeless tobacco: Current User  Substance and Sexual Activity  . Alcohol use: Yes    Comment: occ  . Drug use: No  . Sexual activity: Yes    Birth control/protection: None  Other Topics Concern  . Not on file  Social History Narrative  . Not on  file   Social Determinants of Health   Financial Resource Strain: Not on file  Food Insecurity: Not on file  Transportation Needs: Not on file  Physical Activity: Not on file  Stress: Not on file  Social Connections: Not on file  Intimate Partner Violence: Not on file    Outpatient Medications Prior to Visit  Medication Sig Dispense Refill  . albuterol (VENTOLIN HFA) 108 (90 Base) MCG/ACT inhaler TAKE 2 PUFFS BY MOUTH EVERY 6 HOURS AS NEEDED FOR WHEEZE OR SHORTNESS OF BREATH 8.5 each 2  . amLODipine (NORVASC) 10 MG tablet Take 1 tablet (10 mg total) by mouth daily. 90 tablet 3  . clonazePAM (KLONOPIN) 1 MG tablet TAKE 1 TABLET BY MOUTH EVERY DAY AS NEEDED FOR ANXIETY 30 tablet 0  . escitalopram (LEXAPRO) 10 MG tablet Take 1 tablet (10 mg total) by mouth daily. 90 tablet 3  . gabapentin (NEURONTIN) 100 MG capsule Take 1 capsule (100 mg total) by mouth 3 (three) times daily. 180 capsule 4  . methocarbamol (ROBAXIN) 750 MG tablet Take by mouth.    . QUEtiapine (SEROQUEL) 100 MG tablet Take 1 tablet (100 mg total) by mouth at bedtime. 90 tablet 3  . topiramate (TOPAMAX) 50 MG tablet Take by mouth.     No facility-administered medications prior to visit.  No Known Allergies  ROS Review of Systems Per hpi     Objective:    Physical Exam Constitutional:      General: He is not in acute distress.    Appearance: Normal appearance. He is normal weight. He is not ill-appearing, toxic-appearing or diaphoretic.  Cardiovascular:     Rate and Rhythm: Normal rate and regular rhythm.     Heart sounds: Normal heart sounds. No murmur heard. No friction rub. No gallop.   Pulmonary:     Effort: Pulmonary effort is normal. No respiratory distress.     Breath sounds: Normal breath sounds. No stridor. No wheezing, rhonchi or rales.  Chest:     Chest wall: No tenderness.  Abdominal:     General: Bowel sounds are normal.     Tenderness: There is no abdominal tenderness.  Neurological:      General: No focal deficit present.     Mental Status: He is alert and oriented to person, place, and time. Mental status is at baseline.  Psychiatric:        Mood and Affect: Mood normal.        Behavior: Behavior normal.        Thought Content: Thought content normal.        Judgment: Judgment normal.     BP (!) 144/74   Pulse 65   Temp 98.3 F (36.8 C) (Temporal)   Resp 18   Ht 6\' 2"  (1.88 m)   Wt 217 lb 12.8 oz (98.8 kg)   SpO2 98%   BMI 27.96 kg/m  Wt Readings from Last 3 Encounters:  07/06/20 217 lb 12.8 oz (98.8 kg)  01/02/20 205 lb (93 kg)  11/21/19 206 lb 9.6 oz (93.7 kg)     There are no preventive care reminders to display for this patient.  There are no preventive care reminders to display for this patient.  Lab Results  Component Value Date   TSH 2.990 11/21/2019   Lab Results  Component Value Date   WBC 6.2 07/06/2020   HGB 12.8 (L) 07/06/2020   HCT 38.5 07/06/2020   MCV 94 07/06/2020   PLT 268 07/06/2020   Lab Results  Component Value Date   NA 141 07/06/2020   K 4.4 07/06/2020   CO2 22 07/06/2020   GLUCOSE 75 07/06/2020   BUN 20 07/06/2020   CREATININE 1.30 (H) 07/06/2020   BILITOT 0.3 07/06/2020   ALKPHOS 91 07/06/2020   AST 27 07/06/2020   ALT 13 07/06/2020   PROT 8.2 07/06/2020   ALBUMIN 4.2 07/06/2020   CALCIUM 9.0 07/06/2020   ANIONGAP 8 11/08/2017   Lab Results  Component Value Date   CHOL 157 11/21/2019   Lab Results  Component Value Date   HDL 43 11/21/2019   Lab Results  Component Value Date   LDLCALC 97 11/21/2019   Lab Results  Component Value Date   TRIG 92 11/21/2019   Lab Results  Component Value Date   CHOLHDL 3.7 11/21/2019   Lab Results  Component Value Date   HGBA1C 6.1 (H) 07/06/2020      Assessment & Plan:   Problem List Items Addressed This Visit      Other   Cough - Primary   Relevant Orders   DG Chest 2 View (Completed)   EKG 12-Lead (Completed)   Brain natriuretic peptide (Completed)     Other Visit Diagnoses    Dependent edema       Relevant Medications  furosemide (LASIX) 20 MG tablet   Other Relevant Orders   DG Chest 2 View (Completed)   EKG 12-Lead (Completed)   Comprehensive metabolic panel (Completed)   Brain natriuretic peptide (Completed)   Ambulatory referral to Cardiology   DOE (dyspnea on exertion)       Relevant Orders   DG Chest 2 View (Completed)   EKG 12-Lead (Completed)   Comprehensive metabolic panel (Completed)   CBC (Completed)   Brain natriuretic peptide (Completed)   Urinalysis (Completed)   Ambulatory referral to Cardiology   Prediabetes       Relevant Orders   Hemoglobin A1c (Completed)   RBBB       Relevant Medications   furosemide (LASIX) 20 MG tablet   Other Relevant Orders   Ambulatory referral to Cardiology      Meds ordered this encounter  Medications  . furosemide (LASIX) 20 MG tablet    Sig: Take 1 tablet (20 mg total) by mouth daily.    Dispense:  30 tablet    Refill:  3    Order Specific Question:   Supervising Provider    Answer:   Neva Seat, JEFFREY R [[id number]]    Follow-up: No follow-ups on file.   PLAN  ekg shows wider s wave than previous study obtained on 11/09/2017 particularly in lead ii, but otherwise no apparent acute changes. Ongoing rbbb. Pt has not seen cardiology in some time.   cxr shows no acute concerns for cardiac or pulmonary disease.  Labs collected  Lasix 20mg  PO qd for edema  Follow up closely with cardiology  Pt notably anxious at today's visit - spent significant portion of time discussing outlook, reasons to be reassured, reasons to have concern.   Reviewed red flags and reasons to seek emergency care with patient  Patient encouraged to call clinic with any questions, comments, or concerns.  , NP

## 2020-07-21 ENCOUNTER — Other Ambulatory Visit: Payer: Self-pay | Admitting: Registered Nurse

## 2020-07-21 DIAGNOSIS — F4323 Adjustment disorder with mixed anxiety and depressed mood: Secondary | ICD-10-CM

## 2020-07-21 NOTE — Telephone Encounter (Signed)
Medication Refill - Medication: clonazePAM (KLONOPIN) 1 MG tablet [   Has the patient contacted their pharmacy?yes (Agent: If no, request that the patient contact the pharmacy for the refill.) (Agent: If yes, when and what did the pharmacy advise?)Contact PCP  Preferred Pharmacy (with phone number or street name):  [id number] - [place], [place] - [address] Phone:  [phone]  Fax:  [phone]       Agent: Please be advised that RX refills may take up to 3 business days. We ask that you follow-up with your pharmacy.

## 2020-07-21 NOTE — Telephone Encounter (Signed)
Requested medication (s) are due for refill today: yes  Requested medication (s) are on the active medication list: yes  Last refill:  06/22/20 #30  Future visit scheduled: no  Notes to clinic:  Please review for refill. Refill not delegated per protocol    Requested Prescriptions  Pending Prescriptions Disp Refills   clonazePAM (KLONOPIN) 1 MG tablet 30 tablet 0    Sig: TAKE 1 TABLET BY MOUTH EVERY DAY AS NEEDED FOR ANXIETY      Not Delegated - Psychiatry:  Anxiolytics/Hypnotics Failed - 07/21/2020  4:57 PM      Failed - This refill cannot be delegated      Failed - Urine Drug Screen completed in last 360 days      Passed - Valid encounter within last 6 months    Recent Outpatient Visits           2 weeks ago Cough   [facility], Richard, NP   6 months ago Adjustment disorder with mixed anxiety and depressed mood   [facility] at [facility], Gerlene Burdock, NP   8 months ago Corn of foot   [facility] at [facility], Gerlene Burdock, NP   1 year ago    Primary Care at [facility], Eilleen Kempf, MD   2 years ago Essential hypertension   Primary Care at [facility], [place], [place]

## 2020-07-23 MED ORDER — CLONAZEPAM 1 MG PO TABS: ORAL_TABLET | ORAL | 0 refills | Status: DC

## 2020-07-23 NOTE — Telephone Encounter (Signed)
Patient is requesting a refill of the following medications: Requested Prescriptions   Pending Prescriptions Disp Refills   clonazePAM (KLONOPIN) 1 MG tablet 30 tablet 0    Sig: TAKE 1 TABLET BY MOUTH EVERY DAY AS NEEDED FOR ANXIETY    Date of patient request: 07/19/20 Last office visit: 07/06/20 Date of last refill: 06/22/20 Last refill amount: 30 Follow up time period per chart:

## 2020-08-02 ENCOUNTER — Other Ambulatory Visit: Payer: Self-pay

## 2020-08-02 ENCOUNTER — Ambulatory Visit (INDEPENDENT_AMBULATORY_CARE_PROVIDER_SITE_OTHER): Payer: Medicare Other | Admitting: Registered Nurse

## 2020-08-02 ENCOUNTER — Encounter: Payer: Self-pay | Admitting: Registered Nurse

## 2020-08-02 VITALS — BP 138/80 | HR 82 | Temp 98.0°F | Resp 18 | Ht 74.0 in | Wt 215.0 lb

## 2020-08-02 DIAGNOSIS — J302 Other seasonal allergic rhinitis: Secondary | ICD-10-CM

## 2020-08-02 DIAGNOSIS — R609 Edema, unspecified: Secondary | ICD-10-CM

## 2020-08-02 DIAGNOSIS — I10 Essential (primary) hypertension: Secondary | ICD-10-CM | POA: Diagnosis not present

## 2020-08-02 MED ORDER — LEVOCETIRIZINE DIHYDROCHLORIDE 5 MG PO TABS: 5.0000 mg | ORAL_TABLET | Freq: Every evening | ORAL | 3 refills | Status: DC

## 2020-08-02 MED ORDER — LOSARTAN POTASSIUM 100 MG PO TABS: 100.0000 mg | ORAL_TABLET | Freq: Every day | ORAL | 3 refills | Status: DC

## 2020-08-02 MED ORDER — FUROSEMIDE 40 MG PO TABS: 40.0000 mg | ORAL_TABLET | Freq: Every day | ORAL | 0 refills | Status: DC

## 2020-08-02 NOTE — Progress Notes (Signed)
Established Patient Office Visit  Subjective:  Patient ID: Jerry Spears., male    DOB: 07/10/1951  Age: 69 y.o. MRN: [id number]  CC:  Chief Complaint  Patient presents with   Foot Swelling    Patient states he is still having some feet swelling and pain since the last visit that is causing him to become unbalanced.   Medication Refill    Medication refill patient do not remember the medication name. Also amlodipine    HPI Jerry Spears. presents for ongoing swelling ble  Has started furosemide. Some relief. Looks like corn removal site is actually healing However, swelling and pain persists No call from cardiology yet - at last visit RBBB and other changes on EKG noted that warranted cardiology follow up. Looks like referral is in work queue at [facility] with Drs. Patwardhan and Ganji.  No new symptoms or concerns. He continue to express a great amount of anxiety about his personal health and refers to bad decisions he had made in the past regarding his health, though he states the lexapro has started to show some improvement in symptoms. No interest in changing doses or medication at this time.  Past Medical History:  Diagnosis Date   Anxiety    Chronic back pain    DDD (degenerative disc disease), lumbar     No past surgical history on file.  No family history on file.  Social History   Socioeconomic History   Marital status: Single    Spouse name: Not on file   Number of children: Not on file   Years of education: Not on file   Highest education level: Not on file  Occupational History   Not on file  Tobacco Use   Smoking status: Current Every Day Smoker    Packs/day: 1.00    Types: Cigarettes   Smokeless tobacco: Current User  Substance and Sexual Activity   Alcohol use: Yes    Comment: occ   Drug use: No   Sexual activity: Yes    Birth control/protection: None  Other Topics Concern   Not on file  Social  History Narrative   Not on file   Social Determinants of Health   Financial Resource Strain: Not on file  Food Insecurity: Not on file  Transportation Needs: Not on file  Physical Activity: Not on file  Stress: Not on file  Social Connections: Not on file  Intimate Partner Violence: Not on file    Outpatient Medications Prior to Visit  Medication Sig Dispense Refill   albuterol (VENTOLIN HFA) 108 (90 Base) MCG/ACT inhaler TAKE 2 PUFFS BY MOUTH EVERY 6 HOURS AS NEEDED FOR WHEEZE OR SHORTNESS OF BREATH 8.5 each 2   clonazePAM (KLONOPIN) 1 MG tablet TAKE 1 TABLET BY MOUTH EVERY DAY AS NEEDED FOR ANXIETY 30 tablet 0   escitalopram (LEXAPRO) 10 MG tablet Take 1 tablet (10 mg total) by mouth daily. 90 tablet 3   gabapentin (NEURONTIN) 100 MG capsule Take 1 capsule (100 mg total) by mouth 3 (three) times daily. 180 capsule 4   methocarbamol (ROBAXIN) 750 MG tablet Take by mouth.     QUEtiapine (SEROQUEL) 100 MG tablet Take 1 tablet (100 mg total) by mouth at bedtime. 90 tablet 3   topiramate (TOPAMAX) 50 MG tablet Take by mouth.     amLODipine (NORVASC) 10 MG tablet Take 1 tablet (10 mg total) by mouth daily. 90 tablet 3   furosemide (LASIX) 20 MG tablet Take  1 tablet (20 mg total) by mouth daily. 30 tablet 3   No facility-administered medications prior to visit.    No Known Allergies  ROS Review of Systems Per hpi     Objective:    Physical Exam Constitutional:      General: He is not in acute distress.    Appearance: Normal appearance. He is normal weight. He is not ill-appearing, toxic-appearing or diaphoretic.  Cardiovascular:     Rate and Rhythm: Normal rate and regular rhythm.     Pulses: Normal pulses.     Heart sounds: Normal heart sounds. No murmur heard. No friction rub. No gallop.   Pulmonary:     Effort: Pulmonary effort is normal. No respiratory distress.     Breath sounds: Normal breath sounds. No stridor. No wheezing, rhonchi or rales.  Chest:      Chest wall: No tenderness.  Musculoskeletal:     Right lower leg: Edema present.     Left lower leg: No edema.  Skin:    General: Skin is warm and dry.  Neurological:     General: No focal deficit present.     Mental Status: He is alert and oriented to person, place, and time. Mental status is at baseline.  Psychiatric:        Mood and Affect: Mood normal.        Behavior: Behavior normal.        Thought Content: Thought content normal.        Judgment: Judgment normal.     BP 138/80    Pulse 82    Temp 98 F (36.7 C) (Temporal)    Resp 18    Ht 6\' 2"  (1.88 m)    Wt 215 lb (97.5 kg)    SpO2 97%    BMI 27.60 kg/m  Wt Readings from Last 3 Encounters:  08/02/20 215 lb (97.5 kg)  07/06/20 217 lb 12.8 oz (98.8 kg)  01/02/20 205 lb (93 kg)     There are no preventive care reminders to display for this patient.  There are no preventive care reminders to display for this patient.  Lab Results  Component Value Date   TSH 2.990 11/21/2019   Lab Results  Component Value Date   WBC 6.2 07/06/2020   HGB 12.8 (L) 07/06/2020   HCT 38.5 07/06/2020   MCV 94 07/06/2020   PLT 268 07/06/2020   Lab Results  Component Value Date   NA 141 07/06/2020   K 4.4 07/06/2020   CO2 22 07/06/2020   GLUCOSE 75 07/06/2020   BUN 20 07/06/2020   CREATININE 1.30 (H) 07/06/2020   BILITOT 0.3 07/06/2020   ALKPHOS 91 07/06/2020   AST 27 07/06/2020   ALT 13 07/06/2020   PROT 8.2 07/06/2020   ALBUMIN 4.2 07/06/2020   CALCIUM 9.0 07/06/2020   ANIONGAP 8 11/08/2017   Lab Results  Component Value Date   CHOL 157 11/21/2019   Lab Results  Component Value Date   HDL 43 11/21/2019   Lab Results  Component Value Date   LDLCALC 97 11/21/2019   Lab Results  Component Value Date   TRIG 92 11/21/2019   Lab Results  Component Value Date   CHOLHDL 3.7 11/21/2019   Lab Results  Component Value Date   HGBA1C 6.1 (H) 07/06/2020      Assessment & Plan:   Problem List Items Addressed This  Visit      Cardiovascular and Mediastinum   Essential hypertension  Relevant Medications   furosemide (LASIX) 40 MG tablet   losartan (COZAAR) 100 MG tablet   Other Relevant Orders   Basic Metabolic Panel   CBC    Other Visit Diagnoses    Seasonal allergies    -  Primary   Relevant Medications   levocetirizine (XYZAL) 5 MG tablet   Dependent edema       Relevant Medications   furosemide (LASIX) 40 MG tablet   Other Relevant Orders   Basic Metabolic Panel   CBC   Brain natriuretic peptide      Meds ordered this encounter  Medications   furosemide (LASIX) 40 MG tablet    Sig: Take 1 tablet (40 mg total) by mouth daily.    Dispense:  90 tablet    Refill:  0    Order Specific Question:   Supervising Provider    Answer:   Neva Seat, JEFFREY R [[id number]]   losartan (COZAAR) 100 MG tablet    Sig: Take 1 tablet (100 mg total) by mouth daily.    Dispense:  90 tablet    Refill:  3    Order Specific Question:   Supervising Provider    Answer:   Neva Seat, JEFFREY R [[id number]]   levocetirizine (XYZAL) 5 MG tablet    Sig: Take 1 tablet (5 mg total) by mouth every evening.    Dispense:  90 tablet    Refill:  3    Order Specific Question:   Supervising Provider    Answer:   Neva Seat, JEFFREY R [[id number]]    Follow-up: No follow-ups on file.   PLAN  Labs collected. Will follow up with the patient as warranted.  Increase furosemide  Stop amlodipine  Start losartan  Compression stockings and elevation  Reconsider furosemide increase if hypokalemia - even borderline - is present  Encourage cardiology follow up   Patient encouraged to call clinic with any questions, comments, or concerns.  I spent 42 minutes with this patient, more than 50% of which was spent counseling and/or educating.   Janeece Agee, NP

## 2020-08-02 NOTE — Patient Instructions (Signed)
° ° ° °  If you have lab work done today you will be contacted with your lab results within the next 2 weeks.  If you have not heard from us then please contact us. The fastest way to get your results is to register for My Chart. ° ° °IF you received an x-ray today, you will receive an invoice from [facility]. Please contact [facility] at [phone] with questions or concerns regarding your invoice.  ° °IF you received labwork today, you will receive an invoice from [facility]. Please contact [facility] at [phone] with questions or concerns regarding your invoice.  ° °Our billing staff will not be able to assist you with questions regarding bills from these companies. ° °You will be contacted with the lab results as soon as they are available. The fastest way to get your results is to activate your My Chart account. Instructions are located on the last page of this paperwork. If you have not heard from us regarding the results in 2 weeks, please contact this office. °  ° ° ° °

## 2020-08-03 LAB — BASIC METABOLIC PANEL
BUN/Creatinine Ratio: 15 (ref 10–24)
BUN: 19 mg/dL (ref 8–27)
CO2: 28 mmol/L (ref 20–29)
Calcium: 9.1 mg/dL (ref 8.6–10.2)
Chloride: 100 mmol/L (ref 96–106)
Creatinine, Ser: 1.27 mg/dL (ref 0.76–1.27)
GFR calc Af Amer: 66 mL/min/{1.73_m2} (ref >59)
GFR calc non Af Amer: 57 mL/min/{1.73_m2} — ABNORMAL LOW (ref >59)
Glucose: 96 mg/dL (ref 65–99)
Potassium: 3.6 mmol/L (ref 3.5–5.2)
Sodium: 140 mmol/L (ref 134–144)

## 2020-08-03 LAB — CBC
Hematocrit: 40.3 % (ref 37.5–51.0)
Hemoglobin: 13.3 g/dL (ref 13.0–17.7)
MCH: 31.3 pg (ref 26.6–33.0)
MCHC: 33 g/dL (ref 31.5–35.7)
MCV: 95 fL (ref 79–97)
Platelets: 246 10*3/uL (ref 150–450)
RBC: 4.25 x10E6/uL (ref 4.14–5.80)
RDW: 12.6 % (ref 11.6–15.4)
WBC: 7 10*3/uL (ref 3.4–10.8)

## 2020-08-03 LAB — BRAIN NATRIURETIC PEPTIDE: BNP: 221.6 pg/mL — ABNORMAL HIGH (ref 0.0–100.0)

## 2020-08-04 NOTE — Progress Notes (Signed)
If we could call patient -   Labs are stable, but I think it's a good idea for him to be seen by cardiology - they should be calling him in the near future.  Thanks,  Luan Pulling

## 2020-09-14 ENCOUNTER — Other Ambulatory Visit: Payer: Self-pay | Admitting: Registered Nurse

## 2020-09-14 DIAGNOSIS — F4323 Adjustment disorder with mixed anxiety and depressed mood: Secondary | ICD-10-CM

## 2020-09-14 NOTE — Telephone Encounter (Signed)
Requested medication (s) are due for refill today: yes  Requested medication (s) are on the active medication list: yes   Last refill:  07/23/2020  Future visit scheduled: yes  Notes to clinic:  this refill cannot be delegated    Requested Prescriptions  Pending Prescriptions Disp Refills   clonazePAM (KLONOPIN) 1 MG tablet 30 tablet 0    Sig: TAKE 1 TABLET BY MOUTH EVERY DAY AS NEEDED FOR ANXIETY      Not Delegated - Psychiatry:  Anxiolytics/Hypnotics Failed - 09/14/2020 11:01 AM      Failed - This refill cannot be delegated      Failed - Urine Drug Screen completed in last 360 days      Passed - Valid encounter within last 6 months    Recent Outpatient Visits           1 month ago Seasonal allergies   [facility], Gerlene Burdock, NP   2 months ago Cough   [facility], Richard, NP   8 months ago Adjustment disorder with mixed anxiety and depressed mood   [facility], Gerlene Burdock, NP   9 months ago Corn of foot   [facility], Gerlene Burdock, NP   1 year ago    [facility] at [facility], Eilleen Kempf, MD       Future Appointments             In 3 weeks Janeece Agee, NP [facility] [place], [facility]

## 2020-09-14 NOTE — Telephone Encounter (Signed)
Medication Refill - Medication: clonazePAM (KLONOPIN) 1 MG tablet   Has the patient contacted their pharmacy? No. (Agent: If no, request that the patient contact the pharmacy for the refill.) (Agent: If yes, when and what did the pharmacy advise?)  Preferred Pharmacy (with phone number or street name): [id number] [place], [place] - [address]  [address] [place] [address]  Phone:  [phone] Fax:  [phone]   Agent: Please be advised that RX refills may take up to 3 business days. We ask that you follow-up with your pharmacy.

## 2020-09-15 MED ORDER — CLONAZEPAM 1 MG PO TABS
ORAL_TABLET | ORAL | 1 refills | Status: DC
Start: 2020-09-15 — End: 2020-10-17

## 2020-09-15 NOTE — Telephone Encounter (Signed)
Patient is requesting a refill of the following medications: Requested Prescriptions   Pending Prescriptions Disp Refills  . clonazePAM (KLONOPIN) 1 MG tablet 30 tablet 1    Sig: TAKE 1 TABLET BY MOUTH EVERY DAY AS NEEDED FOR ANXIETY   Date of patient request: 09/15/20 Last office visit: 08/02/20 Date of last refill: 07/23/20 Last refill amount: 30 +0 Follow up time period per chart: 10/11/20

## 2020-10-11 ENCOUNTER — Encounter: Payer: Self-pay | Admitting: Registered Nurse

## 2020-10-11 ENCOUNTER — Ambulatory Visit (INDEPENDENT_AMBULATORY_CARE_PROVIDER_SITE_OTHER): Payer: Medicare Other | Admitting: Registered Nurse

## 2020-10-11 ENCOUNTER — Other Ambulatory Visit: Payer: Self-pay

## 2020-10-11 VITALS — BP 133/72 | HR 66 | Temp 98.0°F | Resp 18 | Ht 74.0 in | Wt 210.2 lb

## 2020-10-11 DIAGNOSIS — R609 Edema, unspecified: Secondary | ICD-10-CM

## 2020-10-11 NOTE — Patient Instructions (Signed)
° ° ° °  If you have lab work done today you will be contacted with your lab results within the next 2 weeks.  If you have not heard from us then please contact us. The fastest way to get your results is to register for My Chart. ° ° °IF you received an x-ray today, you will receive an invoice from [facility]. Please contact [facility] at [phone] with questions or concerns regarding your invoice.  ° °IF you received labwork today, you will receive an invoice from [facility]. Please contact [facility] at [phone] with questions or concerns regarding your invoice.  ° °Our billing staff will not be able to assist you with questions regarding bills from these companies. ° °You will be contacted with the lab results as soon as they are available. The fastest way to get your results is to activate your My Chart account. Instructions are located on the last page of this paperwork. If you have not heard from us regarding the results in 2 weeks, please contact this office. °  ° ° ° °

## 2020-10-12 ENCOUNTER — Telehealth: Payer: Self-pay | Admitting: Registered Nurse

## 2020-10-12 NOTE — Telephone Encounter (Signed)
Pt is calling and he seen rich yesterday and he is still waiting on nasal spray. Pt has been using nasal spray he got [facility]. Pt also needs a refill on clonazepam. [facility] [address] [facility] [place] phone number [phone]

## 2020-10-12 NOTE — Telephone Encounter (Signed)
Waiting on nasal spray from appt. Pt is not in need of clonapin last refill 09/15/2020 and 1 refill was included

## 2020-10-13 NOTE — Progress Notes (Signed)
Established Patient Office Visit  Subjective:  Patient ID: Jerry Berkel., male    DOB: 20-Dec-1950  Age: 70 y.o. MRN: [id number]  CC:  Chief Complaint  Patient presents with   Follow-up    Patient states he is here for a 6 week follow up. Patient states he is still having problems with swelling.    HPI Jerry Spears. presents for follow up on swelling Has been trying to make lifestyle adjustments, seems to help somewhat, but he continues to feel down about his overall health In particular, he's focusing on his poor dentition including multiple broken teeth. He has immense anxiety regarding dental work that has stopped him from pursuing this. Concerned for infections and the effect dental infections can have on his heart. Admits that cardiology had called him, he saw the voicemail, but did not call back. Unsure of why he did not return the call. Admits likely mental health played a role.   Past Medical History:  Diagnosis Date   Anxiety    Chronic back pain    DDD (degenerative disc disease), lumbar     History reviewed. No pertinent surgical history.  History reviewed. No pertinent family history.  Social History   Socioeconomic History   Marital status: Single    Spouse name: Not on file   Number of children: Not on file   Years of education: Not on file   Highest education level: Not on file  Occupational History   Not on file  Tobacco Use   Smoking status: Every Day    Packs/day: 1.00    Types: Cigarettes   Smokeless tobacco: Current  Substance and Sexual Activity   Alcohol use: Yes    Comment: occ   Drug use: No   Sexual activity: Yes    Birth control/protection: None  Other Topics Concern   Not on file  Social History Narrative   Not on file   Social Determinants of Health   Financial Resource Strain: Not on file  Food Insecurity: Not on file  Transportation Needs: Not on file  Physical Activity: Not on file  Stress: Not on file   Social Connections: Not on file  Intimate Partner Violence: Not on file    Outpatient Medications Prior to Visit  Medication Sig Dispense Refill   escitalopram (LEXAPRO) 10 MG tablet Take 1 tablet (10 mg total) by mouth daily. 90 tablet 3   gabapentin (NEURONTIN) 100 MG capsule Take 1 capsule (100 mg total) by mouth 3 (three) times daily. 180 capsule 4   levocetirizine (XYZAL) 5 MG tablet Take 1 tablet (5 mg total) by mouth every evening. 90 tablet 3   losartan (COZAAR) 100 MG tablet Take 1 tablet (100 mg total) by mouth daily. 90 tablet 3   methocarbamol (ROBAXIN) 750 MG tablet Take by mouth.     QUEtiapine (SEROQUEL) 100 MG tablet Take 1 tablet (100 mg total) by mouth at bedtime. 90 tablet 3   topiramate (TOPAMAX) 50 MG tablet Take by mouth.     albuterol (VENTOLIN HFA) 108 (90 Base) MCG/ACT inhaler TAKE 2 PUFFS BY MOUTH EVERY 6 HOURS AS NEEDED FOR WHEEZE OR SHORTNESS OF BREATH 8.5 each 2   clonazePAM (KLONOPIN) 1 MG tablet TAKE 1 TABLET BY MOUTH EVERY DAY AS NEEDED FOR ANXIETY 30 tablet 1   furosemide (LASIX) 40 MG tablet Take 1 tablet (40 mg total) by mouth daily. 90 tablet 0   No facility-administered medications prior to visit.  No Known Allergies  ROS Review of Systems  Constitutional: Negative.   HENT: Negative.    Eyes: Negative.   Respiratory: Negative.    Cardiovascular: Negative.   Gastrointestinal: Negative.   Genitourinary: Negative.   Musculoskeletal: Negative.   Skin: Negative.   Neurological: Negative.   Psychiatric/Behavioral: Negative.       Objective:    Physical Exam Constitutional:      General: He is not in acute distress.    Appearance: Normal appearance. He is normal weight. He is not ill-appearing, toxic-appearing or diaphoretic.  Cardiovascular:     Rate and Rhythm: Normal rate and regular rhythm.     Heart sounds: Normal heart sounds. No murmur heard.   No friction rub. No gallop.  Pulmonary:     Effort: Pulmonary effort is normal. No  respiratory distress.     Breath sounds: Normal breath sounds. No stridor. No wheezing, rhonchi or rales.  Chest:     Chest wall: No tenderness.  Musculoskeletal:     Right lower leg: Edema (+2 pitting) present.     Left lower leg: Edema (+2 pitting) present.  Neurological:     General: No focal deficit present.     Mental Status: He is alert and oriented to person, place, and time. Mental status is at baseline.  Psychiatric:        Mood and Affect: Mood normal.        Behavior: Behavior normal.        Thought Content: Thought content normal.        Judgment: Judgment normal.    BP 133/72   Pulse 66   Temp 98 F (36.7 C) (Temporal)   Resp 18   Ht 6\' 2"  (1.88 m)   Wt 210 lb 3.2 oz (95.3 kg)   SpO2 96%   BMI 26.99 kg/m  Wt Readings from Last 3 Encounters:  10/11/20 210 lb 3.2 oz (95.3 kg)  08/02/20 215 lb (97.5 kg)  07/06/20 217 lb 12.8 oz (98.8 kg)     Health Maintenance Due  Topic Date Due   COVID-19 Vaccine (1) Never done   Pneumonia Vaccine 7+ Years old (1 - PCV) Never done   TETANUS/TDAP  Never done   COLONOSCOPY (Pts 45-16yrs Insurance coverage will need to be confirmed)  Never done   Zoster Vaccines- Shingrix (1 of 2) Never done   INFLUENZA VACCINE  Never done    There are no preventive care reminders to display for this patient.  Lab Results  Component Value Date   TSH 2.990 11/21/2019   Lab Results  Component Value Date   WBC 7.0 08/02/2020   HGB 13.3 08/02/2020   HCT 40.3 08/02/2020   MCV 95 08/02/2020   PLT 246 08/02/2020   Lab Results  Component Value Date   NA 140 08/02/2020   K 3.6 08/02/2020   CO2 28 08/02/2020   GLUCOSE 96 08/02/2020   BUN 19 08/02/2020   CREATININE 1.27 08/02/2020   BILITOT 0.3 07/06/2020   ALKPHOS 91 07/06/2020   AST 27 07/06/2020   ALT 13 07/06/2020   PROT 8.2 07/06/2020   ALBUMIN 4.2 07/06/2020   CALCIUM 9.1 08/02/2020   ANIONGAP 8 11/08/2017   Lab Results  Component Value Date   CHOL 157 11/21/2019    Lab Results  Component Value Date   HDL 43 11/21/2019   Lab Results  Component Value Date   LDLCALC 97 11/21/2019   Lab Results  Component Value Date  TRIG 92 11/21/2019   Lab Results  Component Value Date   CHOLHDL 3.7 11/21/2019   Lab Results  Component Value Date   HGBA1C 6.1 (H) 07/06/2020      Assessment & Plan:   Problem List Items Addressed This Visit   None Visit Diagnoses     Dependent edema    -  Primary        No orders of the defined types were placed in this encounter.   Follow-up: Return if symptoms worsen or fail to improve.   PLAN Discussed the importance of following up with cardiology in regards to this concern. Reviewed recent labs and last available EKG with patient. Pt voices understanding of importance of cardiology consulting. Reviewed impact that dental health can have on cardiac well being. Pt voices understanding. Plans to est with community dentist soon. Patient encouraged to call clinic with any questions, comments, or concerns.  Janeece Agee, NP

## 2020-10-15 NOTE — Telephone Encounter (Signed)
Pt is calling and waiting on his clonazepam. Pt is out

## 2020-10-17 ENCOUNTER — Other Ambulatory Visit: Payer: Self-pay | Admitting: Registered Nurse

## 2020-10-17 DIAGNOSIS — F4323 Adjustment disorder with mixed anxiety and depressed mood: Secondary | ICD-10-CM

## 2020-10-17 MED ORDER — CLONAZEPAM 1 MG PO TABS: ORAL_TABLET | ORAL | 1 refills | Status: DC

## 2020-10-25 ENCOUNTER — Other Ambulatory Visit: Payer: Self-pay | Admitting: Registered Nurse

## 2020-10-25 DIAGNOSIS — R609 Edema, unspecified: Secondary | ICD-10-CM

## 2020-10-25 DIAGNOSIS — I10 Essential (primary) hypertension: Secondary | ICD-10-CM

## 2021-01-20 ENCOUNTER — Other Ambulatory Visit: Payer: Self-pay | Admitting: Registered Nurse

## 2021-01-20 DIAGNOSIS — F4323 Adjustment disorder with mixed anxiety and depressed mood: Secondary | ICD-10-CM

## 2021-01-20 NOTE — Telephone Encounter (Signed)
LFD 10/17/20 #30 with 1 refill LOV 10/11/20 NOV none

## 2021-04-04 ENCOUNTER — Other Ambulatory Visit: Payer: Self-pay | Admitting: Registered Nurse

## 2021-04-04 DIAGNOSIS — R059 Cough, unspecified: Secondary | ICD-10-CM

## 2021-04-07 ENCOUNTER — Ambulatory Visit (INDEPENDENT_AMBULATORY_CARE_PROVIDER_SITE_OTHER): Payer: Medicare Other

## 2021-04-07 ENCOUNTER — Encounter: Payer: Self-pay | Admitting: Podiatry

## 2021-04-07 ENCOUNTER — Other Ambulatory Visit: Payer: Self-pay

## 2021-04-07 ENCOUNTER — Ambulatory Visit: Payer: Medicare Other | Admitting: Podiatry

## 2021-04-07 DIAGNOSIS — M778 Other enthesopathies, not elsewhere classified: Secondary | ICD-10-CM

## 2021-04-07 DIAGNOSIS — L84 Corns and callosities: Secondary | ICD-10-CM

## 2021-04-07 MED ORDER — TRIAMCINOLONE ACETONIDE 10 MG/ML IJ SUSP
10.0000 mg | Freq: Once | INTRAMUSCULAR | Status: AC
  Administered 2021-04-07: 10 mg

## 2021-04-07 NOTE — Progress Notes (Signed)
Subjective:   Patient ID: Jerry Spears., male   DOB: 70 y.o.   MRN: [id number]   HPI Patient presents stating he has a lot of pain in the fifth metatarsal bone of his left foot and a number of other lesions that get sore.  States that it did not do as well as last time and did well when we treated this a number of years ago   ROS      Objective:  Physical Exam  Neurovascular status intact with inflammation fluid buildup around the fifth MPJ left foot with lesion subfifth metatarsal bilateral subfirst metatarsal bilateral     Assessment:  Inflammatory capsulitis of the fifth MPJ left foot with fluid buildup prominent metatarsal bone and lesions on both the moderately painful when pressed     Plan:  H&P reviewed condition discussed the possibility at 1 point in future for fifth met head resection but went ahead today and did a plantar capsular injection of the fifth MPJ 3 mg dexamethasone Kenalog 5 mg Xylocaine and debrided numerous lesions on both feet with no iatrogenic bleeding noted     Patient presents

## 2021-04-15 ENCOUNTER — Other Ambulatory Visit: Payer: Self-pay | Admitting: Registered Nurse

## 2021-04-15 DIAGNOSIS — F4323 Adjustment disorder with mixed anxiety and depressed mood: Secondary | ICD-10-CM

## 2021-04-15 NOTE — Telephone Encounter (Signed)
LFD 01/20/21 #30 with 1 refill LOV 10/17/20 NOV none

## 2021-06-02 ENCOUNTER — Encounter: Payer: Self-pay | Admitting: Registered Nurse

## 2021-06-24 ENCOUNTER — Other Ambulatory Visit: Payer: Self-pay | Admitting: Registered Nurse

## 2021-06-24 DIAGNOSIS — I10 Essential (primary) hypertension: Secondary | ICD-10-CM

## 2021-08-09 ENCOUNTER — Ambulatory Visit: Payer: Medicare Other | Admitting: Registered Nurse

## 2021-09-09 ENCOUNTER — Other Ambulatory Visit: Payer: Self-pay | Admitting: Registered Nurse

## 2021-09-09 DIAGNOSIS — I10 Essential (primary) hypertension: Secondary | ICD-10-CM

## 2021-10-05 ENCOUNTER — Other Ambulatory Visit: Payer: Self-pay

## 2021-10-05 ENCOUNTER — Ambulatory Visit (INDEPENDENT_AMBULATORY_CARE_PROVIDER_SITE_OTHER): Payer: Medicare Other | Admitting: Podiatry

## 2021-10-05 ENCOUNTER — Ambulatory Visit (INDEPENDENT_AMBULATORY_CARE_PROVIDER_SITE_OTHER): Payer: Medicare Other

## 2021-10-05 DIAGNOSIS — M21611 Bunion of right foot: Secondary | ICD-10-CM

## 2021-10-05 DIAGNOSIS — M21622 Bunionette of left foot: Secondary | ICD-10-CM

## 2021-10-05 DIAGNOSIS — M21612 Bunion of left foot: Secondary | ICD-10-CM

## 2021-10-05 DIAGNOSIS — M21619 Bunion of unspecified foot: Secondary | ICD-10-CM

## 2021-10-05 DIAGNOSIS — M21621 Bunionette of right foot: Secondary | ICD-10-CM

## 2021-10-06 ENCOUNTER — Telehealth: Payer: Self-pay

## 2021-10-06 NOTE — Progress Notes (Signed)
Subjective:  ? ?Patient ID: Jerry Spears., male   DOB: 71 y.o.   MRN: [id number]  ? ?HPI ?Patient states he has severe pain underneath his left fifth metatarsal and at the last treatment it came back within a couple weeks and became very tender and hard to walk on.  He is got large keratotic lesion which is very thick and increasingly hard for him to be ambulatory with ? ? ?ROS ? ? ?   ?Objective:  ?Physical Exam  ?Severe lesion formation fifth metatarsal left with thickness and pain that is not responding to trimming medication and offloading ? ?   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 ?   ?Plan:  ?H&P reviewed condition at great length.  I do think that fifth metatarsal head resection along with immobilization is his best option even though there is no long-term guarantees this will solve the problem I do feel or make a huge difference for him.  He wants to go this route and I allowed him to read consent form going over alternative treatments complications.  He does smoke a pack per day but with a relatively quick simple procedure like this I do think he would heal but he does understand that is a risk factor and again I did discuss with him that there is no guarantee as far as complete resolution of symptoms and that recovery takes several months.  He wants surgery is motivated signed consent form and today I dispensed air fracture walker with instructions on usage and I want him to get used to it prior to surgery and find a shoe on the opposite foot that works well for him ? ?X-rays dated today do indicate some increased density around the fifth metatarsal head left over right with enlargement of the fifth metatarsal head ?   ? ? ?

## 2021-10-06 NOTE — Telephone Encounter (Signed)
DOS 10/18/2021 ? ?METATARSAL HEAD RESECTION 5TH LT- 28113 ? ?[employer] MEDICARE EFFECTIVE DATE - 08/07/2021 ? ?PLAN DEDUCTIBLE - $0.00 ?OUT OF POCKET - $3600.00 W/ $3600.00 REMAINING ? ?CO-INSURANCE ?0% / Day OUTPATIENT SURGERY ?0% / Day OUTPATIENT HOSPITAL ?COPAY ?$295 / Day OUTPATIENT SURGERY ?$295 / Day OUTPATIENT HOSPITA ? ? ?Notification or Prior Authorization is not required for the requested services ? ?Decision ID #:[id number] ?

## 2021-10-14 ENCOUNTER — Telehealth: Payer: Self-pay

## 2021-10-14 NOTE — Telephone Encounter (Signed)
Jerry Spears called to reschedule his surgery with Dr. Charlsie Merles on 10/18/2021. He stated he will be responsible for $190.00 copay and he will not have it till the end of the month. I have him rescheduled to 11/08/2021. Notified Dr Charlsie Merles and Aram Beecham with [facility] ?

## 2021-10-24 ENCOUNTER — Encounter: Payer: Medicare Other | Admitting: Podiatrist

## 2021-11-07 MED ORDER — HYDROCODONE-ACETAMINOPHEN 10-325 MG PO TABS: 1.0000 | ORAL_TABLET | Freq: Three times a day (TID) | ORAL | 0 refills | Status: AC | PRN

## 2021-11-07 NOTE — Addendum Note (Signed)
Addended by: Lenn Sink on: 11/07/2021 01:25 PM ? ? Modules accepted: Orders ? ?

## 2021-11-08 ENCOUNTER — Encounter: Payer: Self-pay | Admitting: Podiatry

## 2021-11-08 ENCOUNTER — Telehealth: Payer: Self-pay | Admitting: Registered Nurse

## 2021-11-08 DIAGNOSIS — I10 Essential (primary) hypertension: Secondary | ICD-10-CM

## 2021-11-08 DIAGNOSIS — M2012 Hallux valgus (acquired), left foot: Secondary | ICD-10-CM | POA: Diagnosis not present

## 2021-11-08 MED ORDER — LOSARTAN POTASSIUM 100 MG PO TABS: 100.0000 mg | ORAL_TABLET | Freq: Every day | ORAL | 0 refills | Status: DC

## 2021-11-08 NOTE — Telephone Encounter (Signed)
Patient medication has been sent to the pharmacy ?

## 2021-11-08 NOTE — Telephone Encounter (Signed)
Pt is out of his bp medication he has an appt with Rich on 11/16/21. ? ?Please advise  ? ?[facility]  ?

## 2021-11-14 ENCOUNTER — Encounter: Payer: Medicare Other | Admitting: Podiatry

## 2021-11-16 ENCOUNTER — Ambulatory Visit (INDEPENDENT_AMBULATORY_CARE_PROVIDER_SITE_OTHER): Payer: Medicare Other | Admitting: Registered Nurse

## 2021-11-16 ENCOUNTER — Ambulatory Visit: Payer: Medicare Other | Admitting: Registered Nurse

## 2021-11-16 ENCOUNTER — Encounter: Payer: Self-pay | Admitting: Registered Nurse

## 2021-11-16 ENCOUNTER — Other Ambulatory Visit: Payer: Self-pay

## 2021-11-16 VITALS — BP 154/97 | HR 63 | Temp 98.2°F | Resp 18 | Ht 74.0 in | Wt 188.2 lb

## 2021-11-16 DIAGNOSIS — R609 Edema, unspecified: Secondary | ICD-10-CM | POA: Diagnosis not present

## 2021-11-16 DIAGNOSIS — F4323 Adjustment disorder with mixed anxiety and depressed mood: Secondary | ICD-10-CM | POA: Diagnosis not present

## 2021-11-16 DIAGNOSIS — I10 Essential (primary) hypertension: Secondary | ICD-10-CM | POA: Diagnosis not present

## 2021-11-16 DIAGNOSIS — R634 Abnormal weight loss: Secondary | ICD-10-CM | POA: Diagnosis not present

## 2021-11-16 DIAGNOSIS — J302 Other seasonal allergic rhinitis: Secondary | ICD-10-CM

## 2021-11-16 DIAGNOSIS — M549 Dorsalgia, unspecified: Secondary | ICD-10-CM

## 2021-11-16 DIAGNOSIS — R053 Chronic cough: Secondary | ICD-10-CM

## 2021-11-16 DIAGNOSIS — Z125 Encounter for screening for malignant neoplasm of prostate: Secondary | ICD-10-CM | POA: Diagnosis not present

## 2021-11-16 DIAGNOSIS — Z1211 Encounter for screening for malignant neoplasm of colon: Secondary | ICD-10-CM

## 2021-11-16 DIAGNOSIS — G8929 Other chronic pain: Secondary | ICD-10-CM

## 2021-11-16 LAB — PSA, MEDICARE: PSA: 0.09 ng/ml — ABNORMAL LOW (ref 0.10–4.00)

## 2021-11-16 LAB — CBC WITH DIFFERENTIAL/PLATELET
Basophils Absolute: 0 10*3/uL (ref 0.0–0.1)
Basophils Relative: 0.7 % (ref 0.0–3.0)
Eosinophils Absolute: 0.3 10*3/uL (ref 0.0–0.7)
Eosinophils Relative: 5.1 % — ABNORMAL HIGH (ref 0.0–5.0)
HCT: 36.4 % — ABNORMAL LOW (ref 39.0–52.0)
Hemoglobin: 12.6 g/dL — ABNORMAL LOW (ref 13.0–17.0)
Lymphocytes Relative: 25.6 % (ref 12.0–46.0)
Lymphs Abs: 1.5 10*3/uL (ref 0.7–4.0)
MCHC: 34.6 g/dL (ref 30.0–36.0)
MCV: 95.5 fl (ref 78.0–100.0)
Monocytes Absolute: 0.6 10*3/uL (ref 0.1–1.0)
Monocytes Relative: 9.2 % (ref 3.0–12.0)
Neutro Abs: 3.6 10*3/uL (ref 1.4–7.7)
Neutrophils Relative %: 59.4 % (ref 43.0–77.0)
Platelets: 181 10*3/uL (ref 150.0–400.0)
RBC: 3.81 Mil/uL — ABNORMAL LOW (ref 4.22–5.81)
RDW: 13.9 % (ref 11.5–15.5)
WBC: 6 10*3/uL (ref 4.0–10.5)

## 2021-11-16 LAB — LIPID PANEL
Cholesterol: 159 mg/dL (ref 0–200)
HDL: 43.4 mg/dL (ref >39.00)
LDL Cholesterol: 91 mg/dL (ref 0–99)
NonHDL: 115.76
Total CHOL/HDL Ratio: 4
Triglycerides: 124 mg/dL (ref 0.0–149.0)
VLDL: 24.8 mg/dL (ref 0.0–40.0)

## 2021-11-16 LAB — COMPREHENSIVE METABOLIC PANEL
ALT: 12 U/L (ref 0–53)
AST: 22 U/L (ref 0–37)
Albumin: 3.7 g/dL (ref 3.5–5.2)
Alkaline Phosphatase: 81 U/L (ref 39–117)
BUN: 21 mg/dL (ref 6–23)
CO2: 30 mEq/L (ref 19–32)
Calcium: 8.7 mg/dL (ref 8.4–10.5)
Chloride: 102 mEq/L (ref 96–112)
Creatinine, Ser: 1.22 mg/dL (ref 0.40–1.50)
GFR: 59.74 mL/min — ABNORMAL LOW (ref >60.00)
Glucose, Bld: 82 mg/dL (ref 70–99)
Potassium: 4.4 mEq/L (ref 3.5–5.1)
Sodium: 137 mEq/L (ref 135–145)
Total Bilirubin: 0.5 mg/dL (ref 0.2–1.2)
Total Protein: 7.2 g/dL (ref 6.0–8.3)

## 2021-11-16 LAB — HEMOGLOBIN A1C: Hgb A1c MFr Bld: 6.2 % (ref 4.6–6.5)

## 2021-11-16 LAB — TSH: TSH: 4.65 u[IU]/mL (ref 0.35–5.50)

## 2021-11-16 MED ORDER — GABAPENTIN 100 MG PO CAPS: 100.0000 mg | ORAL_CAPSULE | Freq: Three times a day (TID) | ORAL | 4 refills | Status: DC

## 2021-11-16 MED ORDER — ALBUTEROL SULFATE HFA 108 (90 BASE) MCG/ACT IN AERS: INHALATION_SPRAY | RESPIRATORY_TRACT | 2 refills | Status: DC

## 2021-11-16 MED ORDER — CLONAZEPAM 1 MG PO TABS: 0.5000 mg | ORAL_TABLET | Freq: Every day | ORAL | 1 refills | Status: DC

## 2021-11-16 MED ORDER — QUETIAPINE FUMARATE 100 MG PO TABS: 100.0000 mg | ORAL_TABLET | Freq: Every day | ORAL | 3 refills | Status: DC

## 2021-11-16 MED ORDER — FUROSEMIDE 40 MG PO TABS: 40.0000 mg | ORAL_TABLET | Freq: Every day | ORAL | 0 refills | Status: DC

## 2021-11-16 MED ORDER — LOSARTAN POTASSIUM 100 MG PO TABS: 100.0000 mg | ORAL_TABLET | Freq: Every day | ORAL | 0 refills | Status: DC

## 2021-11-16 MED ORDER — ESCITALOPRAM OXALATE 10 MG PO TABS: 10.0000 mg | ORAL_TABLET | Freq: Every day | ORAL | 3 refills | Status: DC

## 2021-11-16 MED ORDER — LEVOCETIRIZINE DIHYDROCHLORIDE 5 MG PO TABS: 5.0000 mg | ORAL_TABLET | Freq: Every evening | ORAL | 3 refills | Status: DC

## 2021-11-16 NOTE — Progress Notes (Signed)
? ?Established Patient Office Visit ? ?Subjective:  ?Patient ID: Jerry Gerhold., male    DOB: November 29, 1950  Age: 71 y.o. MRN: [id number] ? ?CC:  ?Chief Complaint  ?Patient presents with  ? Follow-up  ?  Patient states he is here to follow up on medication and weight loss.  ? Medication Refill  ?  Patient would need a medication refill on multiple medications.  ? ? ?HPI ?Jerry Govern. presents for follow up ? ?Notes bunionectomy on Monday with podiatry ?Doing well since. Has follow up tomorrow. ? ?Notes he does need medication refill ?All meds. ? ?Hypertension: ?Patient Currently taking: losartan 100mg  po qd, furosemide 40mg  po qd ?Good effect. No AEs. ?Denies CV symptoms including: chest pain, shob, doe, headache, visual changes, fatigue, claudication, and dependent edema.  ? ?Previous readings and labs: ?BP Readings from Last 3 Encounters:  ?11/16/21 (!) 154/97  ?10/11/20 133/72  ?08/02/20 138/80  ? ?Lab Results  ?Component Value Date  ? CREATININE 1.27 08/02/2020  ? ?Anxiety and Depression ?Escitalopram 10mg  po qd, clonazepam 1mg  po qd prn, seroquel 100mg  po qhs prn ?Good effect, but has been out of these for some time. ? ?Chronic Back Pain ?Gabapentin 100mg  po tid prn. Good effect, no AE. ?No new pains. ? ?Cough ?Long term smoker - 45+ pack year hx ?Albuterol prn effective. ?Uses xyzal daily as needed during allergy season ? ?Weight loss ?Unintentional weight loss of 22 lbs in past year.  ?Some constipation ?No abdominal pain ?No blood or mucus in stool. ?Has not had colon ca screen ?Has not had CT lung ca screen within past year - does meet USPSTF guidelines for screening.  ? ? ?Outpatient Medications Prior to Visit  ?Medication Sig Dispense Refill  ? methocarbamol (ROBAXIN) 750 MG tablet Take by mouth.    ? topiramate (TOPAMAX) 50 MG tablet Take by mouth.    ? albuterol (VENTOLIN HFA) 108 (90 Base) MCG/ACT inhaler TAKE 2 PUFFS BY MOUTH EVERY 6 HOURS AS NEEDED FOR WHEEZE OR SHORTNESS OF BREATH 8.5  each 2  ? clonazePAM (KLONOPIN) 1 MG tablet TAKE 1 TABLET BY MOUTH EVERY DAY AS NEEDED FOR ANXIETY 30 tablet 1  ? escitalopram (LEXAPRO) 10 MG tablet Take 1 tablet (10 mg total) by mouth daily. 90 tablet 3  ? furosemide (LASIX) 40 MG tablet TAKE 1 TABLET BY MOUTH EVERY DAY 90 tablet 0  ? gabapentin (NEURONTIN) 100 MG capsule Take 1 capsule (100 mg total) by mouth 3 (three) times daily. 180 capsule 4  ? levocetirizine (XYZAL) 5 MG tablet Take 1 tablet (5 mg total) by mouth every evening. 90 tablet 3  ? losartan (COZAAR) 100 MG tablet Take 1 tablet (100 mg total) by mouth daily. 30 tablet 0  ? QUEtiapine (SEROQUEL) 100 MG tablet Take 1 tablet (100 mg total) by mouth at bedtime. 90 tablet 3  ? ?No facility-administered medications prior to visit.  ? ? ?Review of Systems ?Per hpi  ? ?  ?Objective:  ?  ? ?BP (!) 154/97   Pulse 63   Temp 98.2 ?F (36.8 ?C) (Temporal)   Resp 18   Ht 6\' 2"  (1.88 m)   Wt 188 lb 3.2 oz (85.4 kg)   SpO2 98%   BMI 24.16 kg/m?  ? ?Wt Readings from Last 3 Encounters:  ?11/16/21 188 lb 3.2 oz (85.4 kg)  ?10/11/20 210 lb 3.2 oz (95.3 kg)  ?08/02/20 215 lb (97.5 kg)  ? ?Physical Exam ?Constitutional:   ?  General: He is not in acute distress. ?   Appearance: Normal appearance. He is normal weight. He is not ill-appearing, toxic-appearing or diaphoretic.  ?Cardiovascular:  ?   Rate and Rhythm: Normal rate and regular rhythm.  ?   Heart sounds: Normal heart sounds. No murmur heard. ?  No friction rub. No gallop.  ?Pulmonary:  ?   Effort: Pulmonary effort is normal. No respiratory distress.  ?   Breath sounds: Normal breath sounds. No stridor. No wheezing, rhonchi or rales.  ?Chest:  ?   Chest wall: No tenderness.  ?Neurological:  ?   General: No focal deficit present.  ?   Mental Status: He is alert and oriented to person, place, and time. Mental status is at baseline.  ?Psychiatric:     ?   Mood and Affect: Mood normal.     ?   Behavior: Behavior normal.     ?   Thought Content: Thought  content normal.     ?   Judgment: Judgment normal.  ? ? ?No results found for any visits on 11/16/21. ? ? ? ?The 10-year ASCVD risk score (Arnett DK, et al., 2019) is: 33% ? ?  ?Assessment & Plan:  ? ?Problem List Items Addressed This Visit   ? ?  ? Cardiovascular and Mediastinum  ? Essential hypertension  ? Relevant Medications  ? furosemide (LASIX) 40 MG tablet  ? losartan (COZAAR) 100 MG tablet  ? Other Relevant Orders  ? CT CHEST ABDOMEN PELVIS W CONTRAST  ? CBC with Differential/Platelet  ? Comprehensive metabolic panel  ? Hemoglobin A1c  ? TSH  ? Lipid panel  ?  ? Other  ? Adjustment disorder with mixed anxiety and depressed mood  ? Relevant Medications  ? clonazePAM (KLONOPIN) 1 MG tablet  ? escitalopram (LEXAPRO) 10 MG tablet  ? QUEtiapine (SEROQUEL) 100 MG tablet  ? Other Relevant Orders  ? Hemoglobin A1c  ? TSH  ? Cough  ? Relevant Medications  ? albuterol (VENTOLIN HFA) 108 (90 Base) MCG/ACT inhaler  ? Other Relevant Orders  ? CT CHEST ABDOMEN PELVIS W CONTRAST  ? Hemoglobin A1c  ? TSH  ? Chronic back pain  ? Relevant Medications  ? clonazePAM (KLONOPIN) 1 MG tablet  ? escitalopram (LEXAPRO) 10 MG tablet  ? gabapentin (NEURONTIN) 100 MG capsule  ? Other Relevant Orders  ? Hemoglobin A1c  ? TSH  ? ?Other Visit Diagnoses   ? ? Unintended weight loss    -  Primary  ? Relevant Orders  ? CT CHEST ABDOMEN PELVIS W CONTRAST  ? Hemoglobin A1c  ? TSH  ? Dependent edema      ? Relevant Medications  ? furosemide (LASIX) 40 MG tablet  ? Other Relevant Orders  ? CT CHEST ABDOMEN PELVIS W CONTRAST  ? Hemoglobin A1c  ? TSH  ? Seasonal allergies      ? Relevant Medications  ? levocetirizine (XYZAL) 5 MG tablet  ? Other Relevant Orders  ? Hemoglobin A1c  ? TSH  ? Colon cancer screening      ? Relevant Orders  ? Ambulatory referral to Gastroenterology  ? Hemoglobin A1c  ? TSH  ? Screening PSA (prostate specific antigen)      ? Relevant Orders  ? Hemoglobin A1c  ? TSH  ? PSA, Medicare ( [place] only)  ? ?  ? ? ?Meds  ordered this encounter  ?Medications  ? albuterol (VENTOLIN HFA) 108 (90 Base) MCG/ACT inhaler  ?  Sig: TAKE 2 PUFFS BY MOUTH EVERY 6 HOURS AS NEEDED FOR WHEEZE OR SHORTNESS OF BREATH  ?  Dispense:  8.5 each  ?  Refill:  2  ?  Order Specific Question:   Supervising Provider  ?  Answer:   Neva SeatGREENE, JEFFREY R [[id number]]  ? clonazePAM (KLONOPIN) 1 MG tablet  ?  Sig: Take 0.5-1 tablets (0.5-1 mg total) by mouth daily.  ?  Dispense:  30 tablet  ?  Refill:  1  ?  Not to exceed 2 additional fills before 07/19/2021  ?  Order Specific Question:   Supervising Provider  ?  Answer:   Neva SeatGREENE, JEFFREY R [[id number]]  ? escitalopram (LEXAPRO) 10 MG tablet  ?  Sig: Take 1 tablet (10 mg total) by mouth daily.  ?  Dispense:  90 tablet  ?  Refill:  3  ?  Order Specific Question:   Supervising Provider  ?  Answer:   Neva SeatGREENE, JEFFREY R [[id number]]  ? furosemide (LASIX) 40 MG tablet  ?  Sig: Take 1 tablet (40 mg total) by mouth daily.  ?  Dispense:  90 tablet  ?  Refill:  0  ?  Order Specific Question:   Supervising Provider  ?  Answer:   Neva SeatGREENE, JEFFREY R [[id number]]  ? gabapentin (NEURONTIN) 100 MG capsule  ?  Sig: Take 1 capsule (100 mg total) by mouth 3 (three) times daily.  ?  Dispense:  180 capsule  ?  Refill:  4  ?  Order Specific Question:   Supervising Provider  ?  Answer:   Neva SeatGREENE, JEFFREY R [[id number]]  ? levocetirizine (XYZAL) 5 MG tablet  ?  Sig: Take 1 tablet (5 mg total) by mouth every evening.  ?  Dispense:  90 tablet  ?  Refill:  3  ?  Order Specific Question:   Supervising Provider  ?  Answer:   Neva SeatGREENE, JEFFREY R [[id number]]  ? losartan (COZAAR) 100 MG tablet  ?  Sig: Take 1 tablet (100 mg total) by mouth daily.  ?  Dispense:  30 tablet  ?  Refill:  0  ?  Order Specific Question:   Supervising Provider  ?  Answer:   Neva SeatGREENE, JEFFREY R [[id number]]  ? QUEtiapine (SEROQUEL) 100 MG tablet  ?  Sig: Take 1 tablet (100 mg total) by mouth at bedtime.  ?  Dispense:  90 tablet  ?  Refill:  3  ?  Order Specific Question:   Supervising Provider  ?  Answer:   Neva SeatGREENE,  JEFFREY R [[id number]]  ? ? ?Return in about 3 months (around 02/15/2022) for Chronic Conditions.  ? ?PLAN ?Refill meds as above ?Labs as above. Will follow up as warranted ?Order ct chest abd pel given long smoking hx, unint

## 2021-11-16 NOTE — Patient Instructions (Addendum)
Jerry Spears -  ? ?Great to see you! ? ?Let's check labs ? ?I have refilled meds ? ?I have ordered imaging - they will call you to schedule. ? ?Call if you need anything ? ?Let's touch base in 3 months ? ?Thanks, ? ?Rich  ? ? ? ?If you have lab work done today you will be contacted with your lab results within the next 2 weeks.  If you have not heard from [place] then please contact us. The fastest way to get your results is to register for My Chart. ? ? ?IF you received an x-ray today, you will receive an invoice from [facility]. Please contact [facility] at [phone] with questions or concerns regarding your invoice.  ? ?IF you received labwork today, you will receive an invoice from [place]. Please contact [facility] at [phone] with questions or concerns regarding your invoice.  ? ?Our billing staff will not be able to assist you with questions regarding bills from these companies. ? ?You will be contacted with the lab results as soon as they are available. The fastest way to get your results is to activate your My Chart account. Instructions are located on the last page of this paperwork. If you have not heard from [place] regarding the results in 2 weeks, please contact this office. ?  ? ? ?

## 2021-11-18 ENCOUNTER — Ambulatory Visit (INDEPENDENT_AMBULATORY_CARE_PROVIDER_SITE_OTHER): Payer: Medicare Other

## 2021-11-18 ENCOUNTER — Ambulatory Visit (INDEPENDENT_AMBULATORY_CARE_PROVIDER_SITE_OTHER): Payer: Medicare Other | Admitting: Podiatry

## 2021-11-18 ENCOUNTER — Encounter: Payer: Self-pay | Admitting: Podiatry

## 2021-11-18 DIAGNOSIS — Z9889 Other specified postprocedural states: Secondary | ICD-10-CM

## 2021-11-20 NOTE — Progress Notes (Signed)
Subjective:  ? ?Patient ID: Jerry Govern., male   DOB: 71 y.o.   MRN: [id number]  ? ?HPI ?Patient states doing very well with surgery very pleased so far ? ? ?ROS ? ? ?   ?Objective:  ?Physical Exam  ?Neurovascular status intact with pain in the left fifth metatarsal that is getting much better with incision sites healing well wound edges well coapted no problem ? ?   ?Assessment:  ?Doing well post fifth metatarsal head resection left ? ?   ?Plan:  ?Patient is doing very well with surgery very pleased so far with how he is doing and reapplied sterile dressing continue compression elevation and immobilization reappoint to recheck encouraged to call questions concerns ? ?X-rays indicate satisfactory removal of metatarsal head no indications of pathology ?   ? ? ?

## 2021-11-30 ENCOUNTER — Inpatient Hospital Stay: Admission: RE | Admit: 2021-11-30 | Payer: Medicare Other | Source: Ambulatory Visit

## 2021-12-07 ENCOUNTER — Other Ambulatory Visit: Payer: Self-pay

## 2021-12-07 ENCOUNTER — Ambulatory Visit (INDEPENDENT_AMBULATORY_CARE_PROVIDER_SITE_OTHER): Payer: Medicare Other | Admitting: Registered Nurse

## 2021-12-07 ENCOUNTER — Encounter: Payer: Self-pay | Admitting: Registered Nurse

## 2021-12-07 VITALS — BP 174/92 | HR 68 | Temp 98.3°F | Resp 18 | Ht 74.0 in | Wt 184.0 lb

## 2021-12-07 DIAGNOSIS — K5909 Other constipation: Secondary | ICD-10-CM

## 2021-12-07 DIAGNOSIS — R630 Anorexia: Secondary | ICD-10-CM | POA: Diagnosis not present

## 2021-12-07 DIAGNOSIS — R609 Edema, unspecified: Secondary | ICD-10-CM | POA: Diagnosis not present

## 2021-12-07 DIAGNOSIS — R634 Abnormal weight loss: Secondary | ICD-10-CM | POA: Diagnosis not present

## 2021-12-07 NOTE — Progress Notes (Signed)
? ?Established Patient Office Visit ? ?Subjective:  ?Patient ID: Jerry Bloyd., male    DOB: 1951/05/13  Age: 71 y.o. MRN: [id number] ? ?CC:  ?Chief Complaint  ?Patient presents with  ? Follow-up  ?  Patient is following up to see what he is going to have a CT for.  ? ? ?HPI ?Jerry Spears. presents for discuss CT abd/pel ? ?He has experienced unintentional weight loss of greater than 7.5% of body mass combined with poor appetite, constipation, and long smoking history.  ? ?He has no blood or mucus in stool. ? ?No abdominal mass. ? ?Outpatient Medications Prior to Visit  ?Medication Sig Dispense Refill  ? albuterol (VENTOLIN HFA) 108 (90 Base) MCG/ACT inhaler TAKE 2 PUFFS BY MOUTH EVERY 6 HOURS AS NEEDED FOR WHEEZE OR SHORTNESS OF BREATH 8.5 each 2  ? clonazePAM (KLONOPIN) 1 MG tablet Take 0.5-1 tablets (0.5-1 mg total) by mouth daily. 30 tablet 1  ? escitalopram (LEXAPRO) 10 MG tablet Take 1 tablet (10 mg total) by mouth daily. 90 tablet 3  ? furosemide (LASIX) 40 MG tablet Take 1 tablet (40 mg total) by mouth daily. 90 tablet 0  ? gabapentin (NEURONTIN) 100 MG capsule Take 1 capsule (100 mg total) by mouth 3 (three) times daily. 180 capsule 4  ? levocetirizine (XYZAL) 5 MG tablet Take 1 tablet (5 mg total) by mouth every evening. 90 tablet 3  ? losartan (COZAAR) 100 MG tablet Take 1 tablet (100 mg total) by mouth daily. 30 tablet 0  ? methocarbamol (ROBAXIN) 750 MG tablet Take by mouth.    ? QUEtiapine (SEROQUEL) 100 MG tablet Take 1 tablet (100 mg total) by mouth at bedtime. 90 tablet 3  ? topiramate (TOPAMAX) 50 MG tablet Take by mouth.    ? ?No facility-administered medications prior to visit.  ? ? ?Review of Systems  ?Constitutional:  Positive for fatigue and unexpected weight change.  ?HENT: Negative.    ?Eyes: Negative.   ?Respiratory: Negative.    ?Cardiovascular: Negative.   ?Gastrointestinal:  Positive for abdominal pain and constipation.  ?Genitourinary: Negative.   ?Musculoskeletal:  Negative.   ?Skin: Negative.   ?Neurological: Negative.   ?Psychiatric/Behavioral: Negative.    ?All other systems reviewed and are negative. ? ?  ?Objective:  ?  ? ?BP (!) 174/92   Pulse 68   Temp 98.3 ?F (36.8 ?C) (Temporal)   Resp 18   Ht 6\' 2"  (1.88 m)   Wt 184 lb (83.5 kg)   SpO2 96%   BMI 23.62 kg/m?  ? ?Wt Readings from Last 3 Encounters:  ?12/07/21 184 lb (83.5 kg)  ?11/16/21 188 lb 3.2 oz (85.4 kg)  ?10/11/20 210 lb 3.2 oz (95.3 kg)  ? ?Physical Exam ?Constitutional:   ?   General: He is not in acute distress. ?   Appearance: Normal appearance. He is normal weight. He is not ill-appearing, toxic-appearing or diaphoretic.  ?Cardiovascular:  ?   Rate and Rhythm: Normal rate and regular rhythm.  ?   Heart sounds: Normal heart sounds. No murmur heard. ?  No friction rub. No gallop.  ?Pulmonary:  ?   Effort: Pulmonary effort is normal. No respiratory distress.  ?   Breath sounds: Normal breath sounds. No stridor. No wheezing, rhonchi or rales.  ?Chest:  ?   Chest wall: No tenderness.  ?Neurological:  ?   General: No focal deficit present.  ?   Mental Status: He is alert and oriented to person,  place, and time. Mental status is at baseline.  ?Psychiatric:     ?   Mood and Affect: Mood normal.     ?   Behavior: Behavior normal.     ?   Thought Content: Thought content normal.     ?   Judgment: Judgment normal.  ? ? ?No results found for any visits on 12/07/21. ? ? ? ?The 10-year ASCVD risk score (Arnett DK, et al., 2019) is: 39.4% ? ?  ?Assessment & Plan:  ? ?Problem List Items Addressed This Visit   ?None ?Visit Diagnoses   ? ? Dependent edema    -  Primary  ? Relevant Orders  ? CT Abdomen Pelvis W Contrast  ? Unintended weight loss      ? Relevant Orders  ? CT Abdomen Pelvis W Contrast  ? Chronic constipation      ? Relevant Orders  ? CT Abdomen Pelvis W Contrast  ? Poor appetite      ? Relevant Orders  ? CT Abdomen Pelvis W Contrast  ? ?  ? ? ?No orders of the defined types were placed in this  encounter. ? ? ?Return if symptoms worsen or fail to improve.  ? ?PLAN ?Discussed CT scan with patient. He voices his intent to complete this. ?Patient encouraged to call clinic with any questions, comments, or concerns. ? ? ?Janeece Agee, NP ?

## 2021-12-07 NOTE — Patient Instructions (Addendum)
Jerry Spears -  ? ?Great to see you ? ?I think it is worth completing the CT scan to help [place] check on things like cancers, diverticulosis, bowel obstruction, inflammation in the gut, and other changes that can be of concern. ? ?I will have that office call you to schedule. ? ?I will update you when results are available ? ?Thank you ? ?Rich  ? ? ?If you have lab work done today you will be contacted with your lab results within the next 2 weeks.  If you have not heard from [place] then please contact us. The fastest way to get your results is to register for My Chart. ? ? ?IF you received an x-ray today, you will receive an invoice from [facility]. Please contact [facility] at [phone] with questions or concerns regarding your invoice.  ? ?IF you received labwork today, you will receive an invoice from [place]. Please contact [facility] at [phone] with questions or concerns regarding your invoice.  ? ?Our billing staff will not be able to assist you with questions regarding bills from these companies. ? ?You will be contacted with the lab results as soon as they are available. The fastest way to get your results is to activate your My Chart account. Instructions are located on the last page of this paperwork. If you have not heard from [place] regarding the results in 2 weeks, please contact this office. ?  ? ? ?

## 2021-12-19 ENCOUNTER — Ambulatory Visit (INDEPENDENT_AMBULATORY_CARE_PROVIDER_SITE_OTHER)
Admission: RE | Admit: 2021-12-19 | Discharge: 2021-12-19 | Disposition: A | Discharge status: Home / Self Care | Payer: Medicare Other | Source: Ambulatory Visit | Attending: Registered Nurse | Admitting: Registered Nurse

## 2021-12-19 DIAGNOSIS — K5909 Other constipation: Secondary | ICD-10-CM

## 2021-12-19 DIAGNOSIS — R634 Abnormal weight loss: Secondary | ICD-10-CM

## 2021-12-19 DIAGNOSIS — R630 Anorexia: Secondary | ICD-10-CM

## 2021-12-19 DIAGNOSIS — R609 Edema, unspecified: Secondary | ICD-10-CM

## 2021-12-19 MED ORDER — IOHEXOL 300 MG/ML  SOLN
100.0000 mL | Freq: Once | INTRAMUSCULAR | Status: AC | PRN
  Administered 2021-12-19: 100 mL via INTRAVENOUS

## 2021-12-21 ENCOUNTER — Encounter: Payer: Self-pay | Admitting: Internal Medicine

## 2021-12-21 ENCOUNTER — Telehealth: Payer: Self-pay

## 2021-12-21 ENCOUNTER — Other Ambulatory Visit: Payer: Self-pay

## 2021-12-21 DIAGNOSIS — R634 Abnormal weight loss: Secondary | ICD-10-CM

## 2021-12-21 NOTE — Telephone Encounter (Signed)
I Have called patient again with no answer. Voicemail not set up. ?

## 2021-12-21 NOTE — Telephone Encounter (Signed)
Patient states he would like the referral for the GI specialist  ?

## 2021-12-21 NOTE — Telephone Encounter (Signed)
Patient referral order has been placed ?

## 2021-12-21 NOTE — Telephone Encounter (Signed)
If you could place referral I'd cosign - on [facility] now and can't place new orders. Dx code unexplained weight loss, should already be on his chart. ? ?Thanks, ? ?Rich

## 2021-12-21 NOTE — Telephone Encounter (Signed)
Patient is returning phone call about CT scan results.  ?

## 2021-12-27 ENCOUNTER — Ambulatory Visit: Payer: Medicare Other | Admitting: Internal Medicine

## 2022-01-03 ENCOUNTER — Encounter: Payer: Self-pay | Admitting: Internal Medicine

## 2022-01-11 ENCOUNTER — Other Ambulatory Visit: Payer: Self-pay | Admitting: Registered Nurse

## 2022-01-11 DIAGNOSIS — I10 Essential (primary) hypertension: Secondary | ICD-10-CM

## 2022-01-24 ENCOUNTER — Other Ambulatory Visit: Payer: Self-pay | Admitting: Registered Nurse

## 2022-01-24 DIAGNOSIS — F4323 Adjustment disorder with mixed anxiety and depressed mood: Secondary | ICD-10-CM

## 2022-01-25 NOTE — Telephone Encounter (Signed)
Patient is requesting a refill of the following medications: Requested Prescriptions   Pending Prescriptions Disp Refills   clonazePAM (KLONOPIN) 1 MG tablet [Pharmacy Med Name: CLONAZEPAM 1 MG TABLET] 30 tablet 1    Sig: Take 0.5-1 tablets (0.5-1 mg total) by mouth daily.    Date of patient request: 01/25/22 Last office visit: 12/07/21 Date of last refill: 11/16/21 Last refill amount: 30

## 2022-02-14 ENCOUNTER — Ambulatory Visit: Payer: Medicare Other | Admitting: Internal Medicine

## 2022-02-15 ENCOUNTER — Ambulatory Visit: Payer: Medicare Other | Admitting: Registered Nurse

## 2022-04-26 ENCOUNTER — Other Ambulatory Visit: Payer: Self-pay | Admitting: Family Medicine

## 2022-04-26 DIAGNOSIS — F4323 Adjustment disorder with mixed anxiety and depressed mood: Secondary | ICD-10-CM

## 2022-04-27 NOTE — Telephone Encounter (Signed)
Patient is requesting a refill of the following medications: Requested Prescriptions   Pending Prescriptions Disp Refills   clonazePAM (KLONOPIN) 1 MG tablet [Pharmacy Med Name: CLONAZEPAM 1 MG TABLET] 30 tablet 1    Sig: TAKE 1/2 TO 1 TABLET (0.5-1 MG TOTAL) BY MOUTH DAILY.    Date of patient request: 04/27/22 Last office visit: 12/07/21 Date of last refill: 01/26/22 Last refill amount: 30

## 2022-07-19 ENCOUNTER — Other Ambulatory Visit: Payer: Self-pay | Admitting: Family Medicine

## 2022-07-19 DIAGNOSIS — F4323 Adjustment disorder with mixed anxiety and depressed mood: Secondary | ICD-10-CM

## 2022-07-19 NOTE — Telephone Encounter (Signed)
Clonazepam 1 mg LOV: 12/07/21 Last Refill:04/27/22 Upcoming appt: none

## 2022-07-20 NOTE — Telephone Encounter (Signed)
Chart reviewed, patient last seen in May by Jerry Spears.  CT scan ordered at that time without apparent concerns but plan for gastroenterology follow-up to discuss weight loss.  I do not see that he is seeing GI or had recent follow-up.  Anxiety last discussed in April.  Controlled substance database reviewed.  Clonazepam 1 mg #30 last filled by Dr. Beverely Low on October 30, previously September 21, August 22, and June 22.  I do not see that he is established with a new primary provider.  Will need to establish with new PCP, recommend follow-up weight loss as well as to discuss anxiety as overdue.  Temporary refill provided once .

## 2022-09-12 ENCOUNTER — Other Ambulatory Visit: Payer: Self-pay

## 2022-09-12 ENCOUNTER — Other Ambulatory Visit: Payer: Self-pay | Admitting: Registered Nurse

## 2022-09-12 DIAGNOSIS — F4323 Adjustment disorder with mixed anxiety and depressed mood: Secondary | ICD-10-CM

## 2022-09-12 DIAGNOSIS — I10 Essential (primary) hypertension: Secondary | ICD-10-CM

## 2022-09-12 MED ORDER — CLONAZEPAM 1 MG PO TABS: ORAL_TABLET | ORAL | 0 refills | Status: DC

## 2022-09-12 MED ORDER — LOSARTAN POTASSIUM 100 MG PO TABS: 100.0000 mg | ORAL_TABLET | Freq: Every day | ORAL | 1 refills | Status: DC

## 2022-09-12 NOTE — Telephone Encounter (Signed)
Encourage patient to contact the pharmacy for refills or they can request refills through [facility]  (Please schedule appointment if patient has not been seen in over a year)  Last OV 12/07/21  WHAT PHARMACY WOULD THEY LIKE THIS SENT TO:  [id number] - [place], [place] - [address] NAME & DOSE: losartan (COZAAR) 100 MG tablet , clonazePAM (KLONOPIN) 1 MG tablet   NOTES/COMMENTS FROM PATIENT: Patient called stating that he needs refills on his medications. Patient NP appt is scheduled with Di Kindle on 10/13/22. Best contact number is [phone].      [place] office please notify patient: It takes 48-72 hours to process rx refill requests Ask patient to call pharmacy to ensure rx is ready before heading there.

## 2022-09-12 NOTE — Telephone Encounter (Signed)
Pt aware.

## 2022-09-12 NOTE — Telephone Encounter (Signed)
Klonopin 1 mg LOV: 12/07/21 Last Refill:07/20/22 Upcoming appt: 10/13/22 with Di Kindle   Losartan has been sent in 30 day with no refill

## 2022-09-12 NOTE — Telephone Encounter (Signed)
Previous patient of Maximiano Coss, NP, upcoming appointment March 8 with new primary care provider.  Controlled substance database reviewed, clonazepam 1 mg #30 last filled on 07/20/2022.  Will refill 1 additional time.

## 2022-10-13 ENCOUNTER — Other Ambulatory Visit: Payer: Self-pay | Admitting: Family Medicine

## 2022-10-13 ENCOUNTER — Ambulatory Visit: Payer: Medicare HMO | Admitting: Family Medicine

## 2022-10-13 ENCOUNTER — Encounter: Payer: Self-pay | Admitting: Family Medicine

## 2022-10-13 VITALS — BP 110/72 | HR 55 | Temp 98.7°F | Ht 74.0 in | Wt 191.1 lb

## 2022-10-13 DIAGNOSIS — I10 Essential (primary) hypertension: Secondary | ICD-10-CM | POA: Diagnosis not present

## 2022-10-13 DIAGNOSIS — Z7689 Persons encountering health services in other specified circumstances: Secondary | ICD-10-CM

## 2022-10-13 DIAGNOSIS — F4323 Adjustment disorder with mixed anxiety and depressed mood: Secondary | ICD-10-CM | POA: Diagnosis not present

## 2022-10-13 DIAGNOSIS — R7303 Prediabetes: Secondary | ICD-10-CM | POA: Diagnosis not present

## 2022-10-13 DIAGNOSIS — R0989 Other specified symptoms and signs involving the circulatory and respiratory systems: Secondary | ICD-10-CM

## 2022-10-13 DIAGNOSIS — K5909 Other constipation: Secondary | ICD-10-CM | POA: Diagnosis not present

## 2022-10-13 DIAGNOSIS — Z1211 Encounter for screening for malignant neoplasm of colon: Secondary | ICD-10-CM

## 2022-10-13 DIAGNOSIS — Z87891 Personal history of nicotine dependence: Secondary | ICD-10-CM | POA: Diagnosis not present

## 2022-10-13 DIAGNOSIS — H6123 Impacted cerumen, bilateral: Secondary | ICD-10-CM

## 2022-10-13 LAB — CBC WITH DIFFERENTIAL/PLATELET
Basophils Absolute: 0 10*3/uL (ref 0.0–0.1)
Basophils Relative: 0.7 % (ref 0.0–3.0)
Eosinophils Absolute: 0.2 10*3/uL (ref 0.0–0.7)
Eosinophils Relative: 3.6 % (ref 0.0–5.0)
HCT: 38.4 % — ABNORMAL LOW (ref 39.0–52.0)
Hemoglobin: 13 g/dL (ref 13.0–17.0)
Lymphocytes Relative: 28.3 % (ref 12.0–46.0)
Lymphs Abs: 1.7 10*3/uL (ref 0.7–4.0)
MCHC: 33.8 g/dL (ref 30.0–36.0)
MCV: 95.5 fl (ref 78.0–100.0)
Monocytes Absolute: 0.5 10*3/uL (ref 0.1–1.0)
Monocytes Relative: 7.6 % (ref 3.0–12.0)
Neutro Abs: 3.6 10*3/uL (ref 1.4–7.7)
Neutrophils Relative %: 59.8 % (ref 43.0–77.0)
Platelets: 231 10*3/uL (ref 150.0–400.0)
RBC: 4.02 Mil/uL — ABNORMAL LOW (ref 4.22–5.81)
RDW: 14.3 % (ref 11.5–15.5)
WBC: 6 10*3/uL (ref 4.0–10.5)

## 2022-10-13 LAB — HEMOGLOBIN A1C: Hgb A1c MFr Bld: 6.3 % (ref 4.6–6.5)

## 2022-10-13 LAB — POCT INFLUENZA A/B
Influenza A, POC: NEGATIVE
Influenza B, POC: NEGATIVE

## 2022-10-13 LAB — POC COVID19 BINAXNOW: SARS Coronavirus 2 Ag: NEGATIVE

## 2022-10-13 LAB — COMPREHENSIVE METABOLIC PANEL
ALT: 13 U/L (ref 0–53)
AST: 20 U/L (ref 0–37)
Albumin: 3.6 g/dL (ref 3.5–5.2)
Alkaline Phosphatase: 75 U/L (ref 39–117)
BUN: 18 mg/dL (ref 6–23)
CO2: 30 mEq/L (ref 19–32)
Calcium: 9.3 mg/dL (ref 8.4–10.5)
Chloride: 103 mEq/L (ref 96–112)
Creatinine, Ser: 1.31 mg/dL (ref 0.40–1.50)
GFR: 54.5 mL/min — ABNORMAL LOW (ref >60.00)
Glucose, Bld: 78 mg/dL (ref 70–99)
Potassium: 5 mEq/L (ref 3.5–5.1)
Sodium: 137 mEq/L (ref 135–145)
Total Bilirubin: 0.3 mg/dL (ref 0.2–1.2)
Total Protein: 7.8 g/dL (ref 6.0–8.3)

## 2022-10-13 LAB — POCT RESPIRATORY SYNCYTIAL VIRUS: RSV Rapid Ag: NEGATIVE

## 2022-10-13 MED ORDER — ESCITALOPRAM OXALATE 10 MG PO TABS: 10.0000 mg | ORAL_TABLET | Freq: Every day | ORAL | 3 refills | Status: DC

## 2022-10-13 NOTE — Assessment & Plan Note (Signed)
Blood pressure is stable today. Continue with Losartan '100mg'$  daily. Ordered CBC and CMP.

## 2022-10-13 NOTE — Patient Instructions (Addendum)
It was a pleasure to meet you and look forward to take care of you. -You signed a controlled substance agreement and you received a copy. -Urine drug screen obtained today.  -Placed a referral to GI for chronic constipation and routine colonoscopy. -START Lexapro '10mg'$ , 1 tablet at night time.  -Ordered CT Lung Screening due to smoking.  -Negative for RSV, covid, and  flu.  -Tried to clean out your ears today with success. Will try again at next appointment. At next appointment, at beginning of appointment, will put ear drops in to cleanse ear canals at the end of the visit.

## 2022-10-13 NOTE — Progress Notes (Signed)
New Patient Office Visit  Subjective    Patient ID: Jerry Spears., male    DOB: 1951-02-11  Age: 72 y.o. MRN: [id number]  CC:  Chief Complaint  Patient presents with   Establish Care    Pt is here today to [facility] Pt states he has some chest congestion for 1 month.  Pt states he has yellow mucous. Pt states his room has a sinus infection. Pt has hx of constipation and would like to discuss this today. Pt has some medications with him today and not very clear on which medication he is taking.    HPI Jerry Spears. presents to establish care with new provider.  Anxiety/Mood: Patient is prescribed Klonopin '1mg'$ , 0.5-'1mg'$  by mouth as needed. He reports he usually takes it 3-4 times a week. He reports it helps with his anxiety. Patient was prescribed Lexapro '10mg'$ , 1 tablet daily. Patient reports he took it once and has not been taking it. He does not know why he didn't take it. Admits to high anxiety. He was prescribed Quetiapine '100mg'$  daily, but reports he is not taking medication.   Back pain: Prescribed Gabapentin '100mg'$  TID, but reports he does not take it everyday. He reports the Gabapentin does help with is lumbar pain. He denies any pain at this appointment.   HTN: Chronic. Patient is prescribed Losartan '100mg'$  daily. He reports he takes his medication every day. Denies monitoring his blood pressure at home. Denies chest pain, headache, lightheadedness, dizziness, or lower extremity edema. He reports he has noticed he has [facility] on exertion starting past summer.  BP Readings from Last 3 Encounters:  10/13/22 110/72  12/07/21 (!) 174/92  11/16/21 (!) 154/97    Chronic constipation: He reports he can go a week without having a bowel movement. He reports he has to force to have a bowel movement. He reports he has been taking Colace, 1 tablet a day.   He reports he has a chest congestion, productive cough-thick, yellow and nasal congestion.  Symptoms started about 2-3  months.  Denies ear pain, sore throat, or fever.  Outpatient Encounter Medications as of 10/13/2022  Medication Sig   albuterol (VENTOLIN HFA) 108 (90 Base) MCG/ACT inhaler TAKE 2 PUFFS BY MOUTH EVERY 6 HOURS AS NEEDED FOR WHEEZE OR SHORTNESS OF BREATH   clonazePAM (KLONOPIN) 1 MG tablet TAKE 1/2 TO 1 TABLET (0.5-1 MG TOTAL) BY MOUTH DAILY.   gabapentin (NEURONTIN) 100 MG capsule Take 1 capsule (100 mg total) by mouth 3 (three) times daily.   losartan (COZAAR) 100 MG tablet Take 1 tablet (100 mg total) by mouth daily.   Phenylephrine-Acetaminophen (DAYTIME SINUS CONGESTION PO) Take 325 mg by mouth daily.   senna-docusate (SENOKOT-S) 8.6-50 MG tablet Take 1 tablet by mouth daily.   [DISCONTINUED] escitalopram (LEXAPRO) 10 MG tablet Take 1 tablet (10 mg total) by mouth daily.   escitalopram (LEXAPRO) 10 MG tablet Take 1 tablet (10 mg total) by mouth daily.   [DISCONTINUED] furosemide (LASIX) 40 MG tablet Take 1 tablet (40 mg total) by mouth daily. (Patient not taking: Reported on 10/13/2022)   [DISCONTINUED] levocetirizine (XYZAL) 5 MG tablet Take 1 tablet (5 mg total) by mouth every evening.   [DISCONTINUED] methocarbamol (ROBAXIN) 750 MG tablet Take by mouth. (Patient not taking: Reported on 10/13/2022)   [DISCONTINUED] QUEtiapine (SEROQUEL) 100 MG tablet Take 1 tablet (100 mg total) by mouth at bedtime. (Patient not taking: Reported on 10/13/2022)   [DISCONTINUED] topiramate (TOPAMAX) 50 MG tablet  Take by mouth. (Patient not taking: Reported on 10/13/2022)   No facility-administered encounter medications on file as of 10/13/2022.    Past Medical History:  Diagnosis Date   Anxiety    Callus    Chronic back pain    Constipation    Corn of foot    DDD (degenerative disc disease), lumbar    Depression    Hypertension     Past Surgical History:  Procedure Laterality Date   FOOT SURGERY Left     Family History  Problem Relation Age of Onset   Aneurysm Mother    Cancer Sister        Bone     Social History   Socioeconomic History   Marital status: Single    Spouse name: Not on file   Number of children: 2   Years of education: Not on file   Highest education level: High school graduate  Occupational History   Not on file  Tobacco Use   Smoking status: Every Day    Packs/day: 1.00    Years: 56.00    Total pack years: 56.00    Types: Cigarettes   Smokeless tobacco: Never  Vaping Use   Vaping Use: Never used  Substance and Sexual Activity   Alcohol use: Not Currently   Drug use: No   Sexual activity: Not Currently    Birth control/protection: None  Other Topics Concern   Not on file  Social History Narrative   Patient has a home, he rents one of his rooms for friend and his girlfriend. 1 dog.    Social Determinants of Health   Financial Resource Strain: Not on file  Food Insecurity: Not on file  Transportation Needs: Not on file  Physical Activity: Not on file  Stress: Not on file  Social Connections: Not on file  Intimate Partner Violence: Not on file    ROS See HPI above    Objective    BP 110/72   Pulse (!) 55   Temp 98.7 F (37.1 C)   Ht '6\' 2"'$  (1.88 m)   Wt 191 lb 2 oz (86.7 kg)   SpO2 95%   BMI 24.54 kg/m   Physical Exam Vitals reviewed.  Constitutional:      General: He is not in acute distress.    Appearance: Normal appearance. He is normal weight. He is not ill-appearing, toxic-appearing or diaphoretic.  HENT:     Head: Normocephalic and atraumatic.     Right Ear: External ear normal. There is impacted cerumen.     Left Ear: External ear normal. There is impacted cerumen.     Nose:     Right Sinus: No maxillary sinus tenderness or frontal sinus tenderness.     Left Sinus: No maxillary sinus tenderness or frontal sinus tenderness.     Mouth/Throat:     Mouth: Mucous membranes are moist.     Pharynx: No oropharyngeal exudate or posterior oropharyngeal erythema.  Eyes:     General:        Right eye: No discharge.         Left eye: No discharge.     Conjunctiva/sclera: Conjunctivae normal.  Cardiovascular:     Rate and Rhythm: Normal rate and regular rhythm.     Heart sounds: Normal heart sounds. No murmur heard.    No friction rub. No gallop.  Pulmonary:     Effort: Pulmonary effort is normal. No respiratory distress.     Breath sounds: Normal breath  sounds.  Skin:    General: Skin is warm and dry.  Neurological:     General: No focal deficit present.     Mental Status: He is alert and oriented to person, place, and time. Mental status is at baseline.  Psychiatric:        Mood and Affect: Mood normal.        Behavior: Behavior normal.        Thought Content: Thought content normal.        Judgment: Judgment normal.      Assessment & Plan:  Upper respiratory symptom -     POCT respiratory syncytial virus -     POC COVID-19 BinaxNow -     POCT Influenza A/B -     CBC with Differential/Platelet  Adjustment disorder with mixed anxiety and depressed mood Assessment & Plan: Continue with Klonopin 0.5-1.0 mg as needed for anxiety. Urine drug screen obtained today. Patient signed controlled substance contract and received a copy. Admits to high anxiety level. Through shared decision making, patient is going to restart Lexapro '10mg'$ , 1 tablet at bedtime for long acting medication for anxiety. Follow up in 2 weeks to evalute with starting medication. Discontinue Quetiapine, patient reports he is not taking medication.   Orders: [id number] 11+Oxyco+Alc+Crt-Bund -     Escitalopram Oxalate; Take 1 tablet (10 mg total) by mouth daily.  Dispense: 90 tablet; Refill: 3  Essential hypertension Assessment & Plan: Blood pressure is stable today. Continue with Losartan '100mg'$  daily. Ordered CBC and CMP.   Orders: -     Comprehensive metabolic panel -     CBC with Differential/Platelet  Prediabetes -     Hemoglobin A1c  Chronic constipation -     Ambulatory referral to Gastroenterology  Colon cancer  screening -     Ambulatory referral to Gastroenterology  History of tobacco abuse -     CT CHEST LUNG CANCER SCREENING LOW DOSE WO CONTRAST; Future  Hearing loss of both ears due to cerumen impaction -     Ear Lavage  Establishing care with new doctor, encounter for  1.Reviewed health maintenance: Declines PNA vaccine; declines Tdap, but aware that if he has an injury, he needs vaccine; declines Shingrix; declines RSV; declines covid booster.  2.Needs Medicare Annual Wellness.  3. Referral to GI for routine colonoscopy and chronic constipation.  4. Negative rapid for RSV, Covid, and influenza. Ordered CBC.  5. Bilateral ear lavage completed due to cerumen impactions. Not good results and could not tolerate it. Will try again on next visit.  6.Lipids panel needs to be completed on next visit. Informed patient to be fasting.  7.Ordered LD CT chest lung cancer screening. Patient continues to smoke cigarettes and not ready to quit.  8. Last A1c was 6.2,prediabetes, back in April 2023. Ordered labs to evaluate.  Lab Results  Component Value Date   HGBA1C 6.2 11/16/2021     Return in about 2 weeks (around 10/27/2022).   Valarie Merino, NP

## 2022-10-13 NOTE — Assessment & Plan Note (Addendum)
Continue with Klonopin 0.5-1.0 mg as needed for anxiety. Urine drug screen obtained today. Patient signed controlled substance contract and received a copy. Admits to high anxiety level. Through shared decision making, patient is going to restart Lexapro '10mg'$ , 1 tablet at bedtime for long acting medication for anxiety. Follow up in 2 weeks to evalute with starting medication. Discontinue Quetiapine, patient reports he is not taking medication.

## 2022-10-15 LAB — DRUG SCREEN 764883 11+OXYCO+ALC+CRT-BUND
Amphetamines, Urine: NEGATIVE ng/mL
BENZODIAZ UR QL: NEGATIVE ng/mL
Barbiturate: NEGATIVE ng/mL
Cannabinoid Quant, Ur: NEGATIVE ng/mL
Cocaine (Metabolite): NEGATIVE ng/mL
Creatinine: 126.7 mg/dL (ref 20.0–300.0)
Ethanol: NEGATIVE %
Meperidine: NEGATIVE ng/mL
Methadone Screen, Urine: NEGATIVE ng/mL
OPIATE SCREEN URINE: NEGATIVE ng/mL
Oxycodone/Oxymorphone, Urine: NEGATIVE ng/mL
Phencyclidine: NEGATIVE ng/mL
Propoxyphene: NEGATIVE ng/mL
Tramadol: NEGATIVE ng/mL
pH, Urine: 5.2 (ref 4.5–8.9)

## 2022-10-16 ENCOUNTER — Other Ambulatory Visit: Payer: Self-pay | Admitting: Family Medicine

## 2022-10-16 ENCOUNTER — Telehealth: Payer: Self-pay | Admitting: Family Medicine

## 2022-10-16 ENCOUNTER — Other Ambulatory Visit: Payer: Self-pay

## 2022-10-16 DIAGNOSIS — R053 Chronic cough: Secondary | ICD-10-CM

## 2022-10-16 DIAGNOSIS — F4323 Adjustment disorder with mixed anxiety and depressed mood: Secondary | ICD-10-CM

## 2022-10-16 MED ORDER — CLONAZEPAM 1 MG PO TABS: ORAL_TABLET | ORAL | 0 refills | Status: DC

## 2022-10-16 MED ORDER — ALBUTEROL SULFATE HFA 108 (90 BASE) MCG/ACT IN AERS: INHALATION_SPRAY | RESPIRATORY_TRACT | 2 refills | Status: DC

## 2022-10-16 NOTE — Telephone Encounter (Signed)
Informed pt that Rx has been sent to pharmacy

## 2022-10-16 NOTE — Telephone Encounter (Addendum)
Encourage patient to contact the pharmacy for refills or they can request refills through [facility]  (Please schedule appointment if patient has not been seen in over a year)  Last ov was 10/13/22  WHAT PHARMACY WOULD THEY LIKE THIS SENT TO:  [id number] [place] DOSE[place]albuterol (VENTOLIN HFA) 108 (90 Base) MCG/ACT inhaler   NOTES/COMMENTS FROM PATIENT: Patient also stated that he was returning a phone [facility] please notify patient: It takes 48-72 hours to process rx refill requests Ask patient to call pharmacy to ensure rx is ready before heading there.

## 2022-10-16 NOTE — Telephone Encounter (Signed)
Clonazepam 1 mg LOV: 10/13/22 Last Refill:09/12/22 Upcoming appt: 11/01/22

## 2022-10-16 NOTE — Telephone Encounter (Signed)
Pt request refill on Klonopin. Sent this rx in for refill (pending).

## 2022-10-30 ENCOUNTER — Ambulatory Visit: Payer: Medicare HMO | Admitting: Family Medicine

## 2022-11-01 ENCOUNTER — Other Ambulatory Visit: Payer: Self-pay | Admitting: Family Medicine

## 2022-11-01 ENCOUNTER — Ambulatory Visit (INDEPENDENT_AMBULATORY_CARE_PROVIDER_SITE_OTHER): Payer: Medicare HMO | Admitting: Family Medicine

## 2022-11-01 ENCOUNTER — Encounter: Payer: Self-pay | Admitting: Family Medicine

## 2022-11-01 VITALS — BP 180/100 | HR 63 | Temp 97.9°F | Ht 74.0 in | Wt 185.0 lb

## 2022-11-01 DIAGNOSIS — I10 Essential (primary) hypertension: Secondary | ICD-10-CM | POA: Diagnosis not present

## 2022-11-01 DIAGNOSIS — F4323 Adjustment disorder with mixed anxiety and depressed mood: Secondary | ICD-10-CM

## 2022-11-01 DIAGNOSIS — H6123 Impacted cerumen, bilateral: Secondary | ICD-10-CM | POA: Diagnosis not present

## 2022-11-01 DIAGNOSIS — R053 Chronic cough: Secondary | ICD-10-CM

## 2022-11-01 DIAGNOSIS — J441 Chronic obstructive pulmonary disease with (acute) exacerbation: Secondary | ICD-10-CM | POA: Insufficient documentation

## 2022-11-01 MED ORDER — BLOOD PRESSURE CUFF MISC: 0 refills | Status: DC

## 2022-11-01 MED ORDER — TRELEGY ELLIPTA 100-62.5-25 MCG/ACT IN AEPB: 1.0000 | INHALATION_SPRAY | Freq: Every day | RESPIRATORY_TRACT | 11 refills | Status: DC

## 2022-11-01 NOTE — Assessment & Plan Note (Signed)
Encouraged patient to go for his LD CT lung screening appointment on April 2nd. Suspect patient has COPD from tobacco use. Patient is not ready to stop smoking, but knows it is harmful. Prescribed Trelegy for chronic cough that could be related to COPD. He has rales present on exam and reports he uses his Albuterol Inhaler every night. He reports he has never seen a pulmonologist.

## 2022-11-01 NOTE — Progress Notes (Signed)
Established Patient Office Visit   Subjective:  Patient ID: Jerry Bort., male    DOB: 07/22/1951  Age: 72 y.o. MRN: [id number]  Chief Complaint  Patient presents with   Follow-up    Pt is here today for F/U. Pt is not FASTING. Pt is sch.for lung screening 11/07/2022     HPI Anxiety and Depression: On previous visit, patient was started on Lexapro 10mg  daily as a long acting medication for anxiety while taking Klonopin 0.5-1mg  as needed daily. Patient reports he didn't take Lexapro. He reports he read the side effects and was concerned about them. He reports he takes Klonopin, 4-5 times a week. He reports his anxiety is not every day. He reports his anxiety has not got worse from previous visit. On previous visit, he admitted to having high anxiety.   HTN: Patients blood pressure is elevated today. Monitored 3 times during visit. He takes Losartan 100mg  daily. He reports he took his medication this morning. Denies chest pain, shortness of breath only with exertion, headaches, dizziness, and lightheadedness.  BP Readings from Last 3 Encounters:  11/01/22 (!) 180/100  10/13/22 110/72  12/07/21 (!) 174/92    Chronic cough: Patient reports he has a chronic cough with productive phlegm sometimes. Patient continues to smoke and not ready to quit, despite realizing smoking is harmful to his health. He is scheduled for a LD CT lung screening on April 2nd.   Ear Impaction: Attempted bilateral ear lavage on previous visit for cerumen impaction. He could tolerate procedure. Will attempt on this visit.     ROS See HPI above     Objective:    BP (!) 180/100 (BP Location: Left Arm, Cuff Size: Normal)   Pulse 63   Temp 97.9 F (36.6 C)   Ht 6\' 2"  (1.88 m)   Wt 185 lb (83.9 kg)   SpO2 95%   BMI 23.75 kg/m    Physical Exam Constitutional:      General: He is not in acute distress.    Appearance: Normal appearance. He is not ill-appearing, toxic-appearing or diaphoretic.  HENT:      Head: Normocephalic and atraumatic.     Right Ear: There is impacted cerumen.     Left Ear: There is impacted cerumen.     Ears:     Comments: Left ear is more impacted than right ear; unable to visualize the TM.  Pulmonary:     Effort: No respiratory distress.     Breath sounds: Rales present.     Comments: Non productive cough during visit.  Musculoskeletal:        General: Normal range of motion.  Skin:    General: Skin is warm and dry.  Neurological:     Mental Status: He is alert and oriented to person, place, and time.  Psychiatric:        Mood and Affect: Affect normal. Mood is anxious (Goes from one topic to the next very frequently).        Speech: Speech normal.        Behavior: Behavior normal. Behavior is cooperative.        Thought Content: Thought content normal.        Judgment: Judgment normal.   PRE-PROCEDURE EXAM: Left and Right TM cannot be visualized due to partial or total occlusion/impaction of the ear canal. PROCEDURE INDICATION: Remove wax to visualize ear drum & relieve discomfort CONSENT:  Verbal PROCEDURE NOTE: Left and Right ear: The CMA, [place], irrigated both ears with warm water and ear drops to remove the wax.  POST- PROCEDURE EXAM: TMs were not successfully visualized. Patient was unable to complete procedure. He reports it is discomforting when attempting to flush canal.     Assessment & Plan:  Adjustment disorder with mixed anxiety and depressed mood Assessment & Plan: Discontinued Lexapro. Continue with Klonopin as needed once a day. Informed patient if his anxiety increased or he needed additional medication of Klonopin, he need a long acting anxiety medication or need a referral to psychiatrist.    Essential hypertension Assessment & Plan: Blood pressure is elevated at today's visit. Assessed blood pressure 3 times with each reading increasing. Continue to take Losartan daily. Prescribed a blood pressure machine for patient to  measure his blood pressure. Recommend to monitor his blood pressure BID for the next weeks and bring in readings. If blood pressure is still elevated, will need to add another blood pressure medication.   Orders: -     Blood Pressure Cuff; Check blood pressure once in the morning and once in the evening.  Dispense: 1 each; Refill: 0  Chronic cough Assessment & Plan: Encouraged patient to go for his LD CT lung screening appointment on April 2nd. Suspect patient has COPD from tobacco use. Patient is not ready to stop smoking, but knows it is harmful. Prescribed Trelegy for chronic cough that could be related to COPD. He has rales present on exam and reports he uses his Albuterol Inhaler every night. He reports he has never seen a pulmonologist.   Orders: -     Trelegy Ellipta; Inhale 1 puff into the lungs daily.  Dispense: 1 each; Refill: 11  Hearing loss of both ears due to cerumen impaction -     Ear Lavage  1.Review health maintenance: Recommend to get PNA vaccine at local pharmacy; Recommend to get tetanus vaccine at local pharmacy; recommend to obtain shingles vaccine at local pharmacy.  2. Ear lavage in both ears for a second time attempted, but unable to remove complete procedure due to patients discomfort. Offered a referral to ENT. Declined ENT referral.  3.Recommend patient to be fasting at his next visit to obtain lipid panel.  4.Placed a referral to GI on previous visit. He was referred to [facility]. He has been notified about the referral, but not set up an appointment. Provided patient the information to make an appointment and encouraged him to follow through appointment.  Return in about 2 weeks (around 11/15/2022) for follow-up.   Valarie Merino, NP

## 2022-11-01 NOTE — Assessment & Plan Note (Addendum)
Blood pressure is elevated at today's visit. Assessed blood pressure 3 times with each reading increasing. Continue to take Losartan daily. Prescribed a blood pressure machine for patient to measure his blood pressure. Recommend to monitor his blood pressure BID for the next weeks and bring in readings. If blood pressure is still elevated, will need to add another blood pressure medication.

## 2022-11-01 NOTE — Patient Instructions (Addendum)
-  Recommend to get PNA vaccine, tetanus vaccine, and shingles at local pharmacy. -Please call to schedule an appointment with [facility]  [address], [place], [place] [address] [phone] a referral at last appointment.  -Ear lavage in both ears for a second time, unable to remove ear wax. Declined ENT referral.  -Please go to your appointment for your lung screening at [facility] on 04/02. -Prescribed Trelegy inhaler for chronic cough. Use one puff daily in the AM.  -Blood pressure is elevated today. Please check your blood pressure once in the morning and once in the evening. Write them down and bring to your next appointment. If blood pressure is elevated, may need to add another blood pressure medication. -Follow up in 2 weeks. At your next appointment, please be fasting. That means only water or black coffee after midnight until your appointment.

## 2022-11-01 NOTE — Telephone Encounter (Signed)
Klonopin 1 mg LOV: 11/01/22 Last Refill:10/16/22 Upcoming appt: 11/10/22

## 2022-11-01 NOTE — Assessment & Plan Note (Addendum)
Discontinued Lexapro. Continue with Klonopin as needed once a day. Informed patient if his anxiety increased or he needed additional medication of Klonopin, he need a long acting anxiety medication or need a referral to psychiatrist.

## 2022-11-01 NOTE — Telephone Encounter (Signed)
Explained to the pt that his Rx has been denied due to being to soon to refill

## 2022-11-05 ENCOUNTER — Other Ambulatory Visit: Payer: Self-pay | Admitting: Family Medicine

## 2022-11-05 DIAGNOSIS — I10 Essential (primary) hypertension: Secondary | ICD-10-CM

## 2022-11-07 ENCOUNTER — Other Ambulatory Visit: Payer: Self-pay

## 2022-11-07 ENCOUNTER — Ambulatory Visit (HOSPITAL_BASED_OUTPATIENT_CLINIC_OR_DEPARTMENT_OTHER)
Admission: RE | Admit: 2022-11-07 | Discharge: 2022-11-07 | Disposition: A | Discharge status: Home / Self Care | Payer: Medicare HMO | Source: Ambulatory Visit | Attending: Family Medicine | Admitting: Family Medicine

## 2022-11-07 DIAGNOSIS — F17210 Nicotine dependence, cigarettes, uncomplicated: Secondary | ICD-10-CM | POA: Diagnosis not present

## 2022-11-07 DIAGNOSIS — Z87891 Personal history of nicotine dependence: Secondary | ICD-10-CM | POA: Insufficient documentation

## 2022-11-07 DIAGNOSIS — J984 Other disorders of lung: Secondary | ICD-10-CM

## 2022-11-07 DIAGNOSIS — I2510 Atherosclerotic heart disease of native coronary artery without angina pectoris: Secondary | ICD-10-CM

## 2022-11-07 MED ORDER — ROSUVASTATIN CALCIUM 10 MG PO TABS: 10.0000 mg | ORAL_TABLET | Freq: Every evening | ORAL | 1 refills | Status: DC

## 2022-11-07 NOTE — Progress Notes (Signed)
Pt agrees to referrals and medications. Pt has lab appt in 6 weeks.   Marland Kitchenth

## 2022-11-10 ENCOUNTER — Encounter: Payer: Medicare HMO | Admitting: Family Medicine

## 2022-11-10 DIAGNOSIS — Z0000 Encounter for general adult medical examination without abnormal findings: Secondary | ICD-10-CM

## 2022-11-15 ENCOUNTER — Ambulatory Visit: Payer: Medicare HMO | Admitting: Family Medicine

## 2022-11-15 DIAGNOSIS — Z09 Encounter for follow-up examination after completed treatment for conditions other than malignant neoplasm: Secondary | ICD-10-CM

## 2022-11-15 DIAGNOSIS — I10 Essential (primary) hypertension: Secondary | ICD-10-CM

## 2022-11-17 ENCOUNTER — Ambulatory Visit (INDEPENDENT_AMBULATORY_CARE_PROVIDER_SITE_OTHER): Payer: Medicare HMO | Admitting: Family Medicine

## 2022-11-17 ENCOUNTER — Encounter: Payer: Self-pay | Admitting: Family Medicine

## 2022-11-17 VITALS — BP 162/74 | HR 64 | Temp 98.6°F | Ht 74.0 in | Wt 192.2 lb

## 2022-11-17 DIAGNOSIS — J984 Other disorders of lung: Secondary | ICD-10-CM

## 2022-11-17 DIAGNOSIS — I10 Essential (primary) hypertension: Secondary | ICD-10-CM | POA: Diagnosis not present

## 2022-11-17 DIAGNOSIS — Z09 Encounter for follow-up examination after completed treatment for conditions other than malignant neoplasm: Secondary | ICD-10-CM

## 2022-11-17 DIAGNOSIS — R053 Chronic cough: Secondary | ICD-10-CM | POA: Diagnosis not present

## 2022-11-17 DIAGNOSIS — F419 Anxiety disorder, unspecified: Secondary | ICD-10-CM

## 2022-11-17 MED ORDER — CLONAZEPAM 1 MG PO TABS: ORAL_TABLET | ORAL | 0 refills | Status: DC

## 2022-11-17 MED ORDER — HYDROCHLOROTHIAZIDE 12.5 MG PO CAPS: 12.5000 mg | ORAL_CAPSULE | Freq: Every day | ORAL | 0 refills | Status: DC

## 2022-11-17 NOTE — Assessment & Plan Note (Signed)
PDMP reviewed. Urine drug screen has been performed within the last 3 months. Controlled substance agreement has been completed within the last year. Refilled Clonazepam 0.5mg -1mg  once a day.

## 2022-11-17 NOTE — Assessment & Plan Note (Signed)
On previous appointment, started Trelegy. Patient reports he was not administering medication correctly and has just started using it. Offered to do a demonstration, but patient declined.

## 2022-11-17 NOTE — Progress Notes (Signed)
Established Patient Office Visit   Subjective:  Patient ID: Jerry Spears., male    DOB: October 23, 1950  Age: 72 y.o. MRN: [id number]  Chief Complaint  Patient presents with   Hypertension    Pt is here to F/U on B/P. Pt states he went to pharmacy and someone was going to get back to him, he has not heard anything. Pt would like refills on clonazePAM (KLONOPIN).    HPI:  Anxiety: Patient takes Clonazepam 0.5mg -1mg  daily. He reports medication is effective and requesting a refill.   HTN: Chronic. Patient takes Losartan 100mg  daily. He reports he has not missed any doses. His blood pressures was elevated on the last visit. He was suppose to monitor his blood pressures and bring readings to his next appointment. However, he states he was not able to get a blood pressure machine from the pharmacy. Blood pressure is still elevated today.   ROS See HPI above     Objective:   BP (!) 162/74   Pulse 64   Temp 98.6 F (37 C)   Ht 6\' 2"  (1.88 m)   Wt 192 lb 4 oz (87.2 kg)   SpO2 96%   BMI 24.68 kg/m    Physical Exam Vitals reviewed.  Constitutional:      General: He is not in acute distress.    Appearance: Normal appearance. He is not ill-appearing, toxic-appearing or diaphoretic.  Eyes:     General:        Right eye: No discharge.        Left eye: No discharge.     Conjunctiva/sclera: Conjunctivae normal.  Cardiovascular:     Rate and Rhythm: Normal rate and regular rhythm.     Heart sounds: Normal heart sounds. No murmur heard.    No friction rub. No gallop.  Pulmonary:     Effort: Pulmonary effort is normal. No respiratory distress.     Breath sounds: Decreased air movement present. Examination of the right-lower field reveals rhonchi. Examination of the left-lower field reveals rhonchi. Rhonchi present.     Comments: Cough during visit  Musculoskeletal:        General: Normal range of motion.  Skin:    General: Skin is warm and dry.  Neurological:     General:  No focal deficit present.     Mental Status: He is alert and oriented to person, place, and time. Mental status is at baseline.  Psychiatric:        Mood and Affect: Mood normal.        Behavior: Behavior normal.        Thought Content: Thought content normal.        Judgment: Judgment normal.      Assessment & Plan:  Essential hypertension Assessment & Plan: Blood pressure is uncontrolled. START Hydrochlorothiazide 12.5mg  tablet, take 1 tablet once a day in the morning. Continue to take Losartan 100mg  daily. Recommend if he is able to get a blood pressure machine, please check blood pressure twice a day, once in the morning and once in the evening. Record his reading and bring to his next appointment in 2 weeks.   Orders: -     hydroCHLOROthiazide; Take 1 capsule (12.5 mg total) by mouth daily.  Dispense: 30 capsule; Refill: 0  Anxiety Assessment & Plan: PDMP reviewed. Urine drug screen has been performed within the last 3 months. Controlled substance agreement has been completed within the last year. Refilled Clonazepam 0.5mg -1mg  once a day.  Orders: -     clonazePAM; TAKE 1/2 TO 1 TABLET (0.5-1 MG TOTAL) BY MOUTH DAILY.  Dispense: 30 tablet; Refill: 0  Chronic cough Assessment & Plan: On previous appointment, started Trelegy. Patient reports he was not administering medication correctly and has just started using it. Offered to do a demonstration, but patient declined.    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 Based on results, he had been referred to pulmonary and infectious disease. He was encouraged to call back to infectious disease for an appointment since he told them he wanted to wait until this appointment with PCP. During appointment, Laverle Patter, referral coordinator was able to get him an appointment with  Pulmonary for  Monday, April 15th at 8:30 am. Placed a referral to pulmonary for this appointment established.   Return in about 2 weeks (around 12/01/2022).   Zandra Abts, NP

## 2022-11-17 NOTE — Assessment & Plan Note (Signed)
Blood pressure is uncontrolled. START Hydrochlorothiazide 12.5mg  tablet, take 1 tablet once a day in the morning. Continue to take Losartan 100mg  daily. Recommend if he is able to get a blood pressure machine, please check blood pressure twice a day, once in the morning and once in the evening. Record his reading and bring to his next appointment in 2 weeks.

## 2022-11-17 NOTE — Patient Instructions (Addendum)
-  START Hydrochlorothiazide 12.5mg  tablet, take 1 tablet once a day in the morning. Continue to take Losartan 100mg  daily. If you are able to get a blood pressure machine, please check blood pressure twice a day, once in the morning and once in the evening. Record your reading and bring to your next appointment in 2 weeks.   -During visit, was able to schedule an appointment with [facility] on Monday, April 15th at 8:30.  -Encourage to call back to infectious disease [phone] to be seen about lung infection from CT lung scan.  -Follow up in 2 weeks.

## 2022-11-20 ENCOUNTER — Ambulatory Visit: Payer: Medicare HMO | Admitting: Internal Medicine

## 2022-11-20 ENCOUNTER — Encounter: Payer: Self-pay | Admitting: Internal Medicine

## 2022-11-20 ENCOUNTER — Telehealth: Payer: Self-pay | Admitting: Internal Medicine

## 2022-11-20 VITALS — BP 150/88 | HR 55 | Temp 98.2°F | Ht 75.0 in | Wt 191.6 lb

## 2022-11-20 DIAGNOSIS — R0981 Nasal congestion: Secondary | ICD-10-CM | POA: Diagnosis not present

## 2022-11-20 DIAGNOSIS — R0609 Other forms of dyspnea: Secondary | ICD-10-CM | POA: Diagnosis not present

## 2022-11-20 DIAGNOSIS — R053 Chronic cough: Secondary | ICD-10-CM

## 2022-11-20 DIAGNOSIS — R9389 Abnormal findings on diagnostic imaging of other specified body structures: Secondary | ICD-10-CM

## 2022-11-20 DIAGNOSIS — F17210 Nicotine dependence, cigarettes, uncomplicated: Secondary | ICD-10-CM

## 2022-11-20 NOTE — Telephone Encounter (Signed)
No authorization required for bronch codes:   31622, 31627, 31628  Availity ref #: [id number]

## 2022-11-20 NOTE — Telephone Encounter (Signed)
PATIENT: Jerry Spears. GENDER: male MRN: [id number] DOB: 27-Oct-1950 ADDRESS: [address] [place] [address]    Please schedule the following:  Diagnosis: Interstitial lung abnormalities.  Micronodular infiltrates.  Rule out MAI Procedure: Video bronchocoopy, flexible bronchoscopy with BAL  Envisia Classifer Transbronchial biopsy: No transbronchial biopsy Anesthesia: Can be moderate sedation or general anesthesia Do you need Fluro?  No fluoroscopy Size of Scope: Small Pre-med nebulized lidocaine: Yes if moderate sedation Priority: December 04, 2022 Friday at [facility]  Location: I prefer afternoon hours because I am rounding in the hospital Does patient have OSA? no DM? no Or Latex allergy? no Medication Restriction: N.p.o. except take blood pressure medications in the morning if he is on it Anticoagulate/Antiplatelet: none Pre-op Labs Ordered: NONE Imaging request: none       MISCELLANEOUS KEY INSTRUCTIONS    Please coordinate Pre-op COVID Testing   Please let Dr Marchelle Gearing know via reply phone message on Epic  Thank you     Key patient medical info     Allergy History: No Known Allergies   Current Outpatient Medications:    albuterol (VENTOLIN HFA) 108 (90 Base) MCG/ACT inhaler, TAKE 2 PUFFS BY MOUTH EVERY 6 HOURS AS NEEDED FOR WHEEZE OR SHORTNESS OF BREATH, Disp: 8.5 each, Rfl: 2   clonazePAM (KLONOPIN) 1 MG tablet, TAKE 1/2 TO 1 TABLET (0.5-1 MG TOTAL) BY MOUTH DAILY., Disp: 30 tablet, Rfl: 0   Fluticasone-Umeclidin-Vilant (TRELEGY ELLIPTA) 100-62.5-25 MCG/ACT AEPB, Inhale 1 puff into the lungs daily., Disp: 1 each, Rfl: 11   gabapentin (NEURONTIN) 100 MG capsule, Take 1 capsule (100 mg total) by mouth 3 (three) times daily., Disp: 180 capsule, Rfl: 4   hydrochlorothiazide (MICROZIDE) 12.5 MG capsule, Take 1 capsule (12.5 mg total) by mouth daily., Disp: 30 capsule, Rfl: 0   losartan (COZAAR) 100 MG tablet, TAKE 1 TABLET BY MOUTH EVERY  DAY, Disp: 30 tablet, Rfl: 1   Phenylephrine-Acetaminophen (DAYTIME SINUS CONGESTION PO), Take 325 mg by mouth daily., Disp: , Rfl:    rosuvastatin (CRESTOR) 10 MG tablet, Take 1 tablet (10 mg total) by mouth at bedtime., Disp: 90 tablet, Rfl: 1   senna-docusate (SENOKOT-S) 8.6-50 MG tablet, Take 1 tablet by mouth daily., Disp: , Rfl:    has a past medical history of Anxiety, Callus, Chronic back pain, Constipation, Corn of foot, DDD (degenerative disc disease), lumbar, Depression, Hypertension, and Lung disease.    has a past surgical history that includes Foot surgery (Left).   SIGNATURE    Dr. Kalman Shan, M.D., F.C.C.P,  Pulmonary and Critical Care Medicine Staff Physician, [facility] Center Director - Interstitial Lung Disease  Program  Pulmonary Fibrosis [facility] at [facility] [place], [place], [address]  Pager: [phone], If no answer or between  15:00h - 7:00h: call [phone] Telephone: [phone]  9:08 AM 11/20/2022

## 2022-11-20 NOTE — Patient Instructions (Addendum)
ICD-10-CM   1. Chronic cough  R05.3     2. DOE (dyspnea on exertion)  R06.09     3. Chronic nasal congestion  R09.81     4. Smoking greater than 40 pack years  F17.210     5. Abnormal CT of the chest  R93.89      You might have an infection called MAI -this is a low-grade noncontagious infection. Apart from this he might also have chronic sinusitis You are at risk for lung cancer but currently do not have lung cancer You might have COPD or chronic bronchitis  Plan -Do blood work for ANA, rheumatoid factor, CCP and QuantiFERON gold -Do CT scan of the sinus without contrast -Continue Trelegy inhaler for now -Schedule bronchoscopy with lavage -aim for December 01, 2022 Friday at [facility] -Do full pulmonary function test  Follow-up - Return to see nurse practitioner to at the end of May 2024

## 2022-11-20 NOTE — Telephone Encounter (Signed)
Pt has been scheduled at [facility] on 4/26 at 3:30.  Case # [id number].  Pt will come to the office on 4/23 to get covid test.  Anika to give appt info to pt with bronch letter.

## 2022-11-20 NOTE — Progress Notes (Signed)
OV 11/20/2022  Subjective:  Patient ID: Jerry Govern., male , DOB: [birth year] , age 72 y.o. , MRN: [id number] , ADDRESS: [address] [place] [address] PCP Alveria Apley, NP Patient Care Team: Alveria Apley, NP as PCP - General (Family Medicine) Charlsie Merles Kirstie Peri, DPM as Consulting Physician (Podiatry)  This Provider for this visit: Treatment Team:  Attending Provider: Kalman Shan, MD    11/20/2022 -   Chief Complaint  Patient presents with   Follow-up    4/2. Abnormal ct, sob occ      HPI Jerry Govern. 72 y.o. -is a new consult.  He is a retired Sports administrator.  He reports that he has had chronic shortness of breath for some years but slightly more pronounced in the last 4 to 5 months..  Particularly notices when he climbs uphill or moves the garbage but not for level ground.  And also for the last few years she has had chronic sinus congestion which is more pronounced in the last 6 months.  He constantly uses nasal sprays.  He also has chronic cough for the same amount of time.  He did have a low-dose CT scan of the chest by his primary care physician and it was abnormal and he was being referred here.  He was told that it "could be serious".  I personally visualized the CT chest and the left lower lobe he has some nonspecific interstitial lung abnormalities with some subpleural sparing.  This was there even a year ago.  Perhaps it is a little bit more pronounced now.  The 1 year ago CT scan was a CT abdomen with lung image.  In the current CT scan there is some micronodular infiltrates in the middle lobe and lingula.  Overall radiology is concerned about MAI.  No definitive ILD no definitive cancer.  His recent lab values are below.      Latest Reference Range & Units 10/13/22 12:27  COMPREHENSIVE METABOLIC PANEL  Rpt !  Sodium 135 - 145 mEq/L 137  Potassium 3.5 - 5.1 mEq/L 5.0  Chloride 96 - 112 mEq/L 103  CO2 19 - 32  mEq/L 30  Glucose 70 - 99 mg/dL 78  BUN 6 - 23 mg/dL 18  Creatinine 78.2 - 956.2 mg/dL 1.30 - 8.65 mg/dL 784.6 9.62  Calcium 8.4 - 10.5 mg/dL 9.3  Alkaline Phosphatase 39 - 117 U/L 75  Albumin 3.5 - 5.2 g/dL 3.6  AST 0 - 37 U/L 20  ALT 0 - 53 U/L 13  Total Protein 6.0 - 8.3 g/dL 7.8  Total Bilirubin 0.2 - 1.2 mg/dL 0.3  GFR >95.28 mL/min 54.50 (L)  WBC 4.0 - 10.5 K/uL 6.0  RBC 4.22 - 5.81 Mil/uL 4.02 (L)  Hemoglobin 13.0 - 17.0 g/dL 41.3  HCT 24.4 - 01.0 % 38.4 (L)  MCV 78.0 - 100.0 fl 95.5  MCHC 30.0 - 36.0 g/dL 27.2  RDW 53.6 - 64.4 % 14.3  Platelets 150.0 - 400.0 K/uL 231.0  Neutrophils 43.0 - 77.0 % 59.8  Lymphocytes 12.0 - 46.0 % 28.3  Monocytes Relative 3.0 - 12.0 % 7.6  Eosinophil 0.0 - 5.0 % 3.6  Basophil 0.0 - 3.0 % 0.7  NEUT# 1.4 - 7.7 K/uL 3.6  Lymphocyte # 0.7 - 4.0 K/uL 1.7  Monocyte # 0.1 - 1.0 K/uL 0.5  Eosinophils Absolute 0.0 - 0.7 K/uL 0.2  Basophils Absolute 0.0 - 0.1 K/uL 0.0  Meperidine Cutoff=200 ng/mL  Negative  Hemoglobin A1C 4.6 - 6.5 % 6.3  pH, Urine 4.5 - 8.9  5.2  Cocaine (Metabolite) Cutoff=300 ng/mL Negative  ETHANOL Cutoff=0.020 % Negative  Phencyclidine Cutoff=25 ng/mL Negative  Propoxyphene Cutoff=300 ng/mL Negative  Cannabinoid Quant, Ur Cutoff=50 ng/mL Negative  Tramadol Cutoff=200 ng/mL Negative  Methadone Scn, Ur Cutoff=300 ng/mL Negative  Oxycodone+Oxymorphone Ur Ql Scn Cutoff=300 ng/mL Negative  Amphetamines Cutoff=1000 ng/mL Negative  OPIATE SCREEN URINE Cutoff=300 ng/mL Negative  BENZODIAZ UR QL Cutoff=200 ng/mL Negative  Barbiturate Cutoff=200 ng/mL Negative  !: Data is abnormal (L): Data is abnormally low Rpt: View report in Results Review for more information    CT CHET 4/2//24-I personally visualized the CT.  Narrative & Impression  CLINICAL DATA:  72 year old male current smoker with 45 pack-year history of smoking. Lung cancer screening examination.   EXAM: CT CHEST WITHOUT CONTRAST LOW-DOSE FOR LUNG CANCER  SCREENING   TECHNIQUE: Multidetector CT imaging of the chest was performed following the standard protocol without IV contrast.   RADIATION DOSE REDUCTION: This exam was performed according to the departmental dose-optimization program which includes automated exposure control, adjustment of the mA and/or kV according to patient size and/or use of iterative reconstruction technique.   COMPARISON:  No priors.   FINDINGS: Cardiovascular: Heart size is normal. There is no significant pericardial fluid, thickening or pericardial calcification. There is aortic atherosclerosis, as well as atherosclerosis of the great vessels of the mediastinum and the coronary arteries, including calcified atherosclerotic plaque in the left anterior descending and left circumflex coronary arteries.   Mediastinum/Nodes: Mediastinal or hilar no pathologically enlarged lymph nodes. Please note that accurate exclusion of hilar adenopathy is limited on noncontrast CT scans. Esophagus is unremarkable in appearance. No axillary lymphadenopathy.   Lungs/Pleura: Are there are numerous pulmonary nodules and nodular areas of apparent airspace consolidation and architectural distortion scattered throughout the lungs bilaterally, favored to be of infectious or inflammatory etiology limiting today's study. Associated with this there are scattered regions of cylindrical bronchiectasis, peripheral bronchiolectasis, thickening of the peribronchovascular interstitium and regional architectural distortion and volume loss. Similar findings were present in the visualized lung bases on prior CT of the abdomen and pelvis 12/19/2021. No pleural effusions. Diffuse bronchial wall thickening with mild centrilobular and paraseptal emphysema.   Upper Abdomen: Aortic atherosclerosis.   Musculoskeletal: Lucent lesion with internal trabeculations in the left side of the T11 vertebral body, compatible with a cavernous hemangioma.  There are no aggressive appearing lytic or blastic lesions noted in the visualized portions of the skeleton.   IMPRESSION: 1. Lung-RADS 0S, incomplete. Specifically, the appearance of the lungs is most suggestive of a chronic indolent atypical infectious process such as MAI (mycobacterium avium intracellulare). This severely limits the sensitivity and specificity of today's examination for detection of lung cancer. Accordingly, this patient may not be a suitable candidate for lung cancer screening. Outpatient referral to Pulmonology for further clinical evaluation and follow-up management is recommended. 2. The "S" modifier above refers to potentially clinically significant non lung cancer related findings. Specifically, there is aortic atherosclerosis, in addition to two-vessel coronary artery disease. Please note that although the presence of coronary artery calcium documents the presence of coronary artery disease, the severity of this disease and any potential stenosis cannot be assessed on this non-gated CT examination. Assessment for potential risk factor modification, dietary therapy or pharmacologic therapy may be warranted, if clinically indicated. 3. Mild diffuse bronchial wall thickening with mild centrilobular and paraseptal emphysema; imaging findings suggestive of underlying COPD.  These results will be called to the ordering clinician or representative by the Radiologist Assistant, and communication documented in the PACS or [employer].   Aortic Atherosclerosis (ICD10-I70.0) and Emphysema (ICD10-J43.9).     Electronically Signed   By: Trudie Reed M.D.   On: 11/07/2022 08:55       has a past medical history of Anxiety, Callus, Chronic back pain, Constipation, Corn of foot, DDD (degenerative disc disease), lumbar, Depression, Hypertension, and Lung disease.   reports that he has been smoking cigarettes. He has a 56.00 pack-year smoking history. He has  never used smokeless tobacco.  Past Surgical History:  Procedure Laterality Date   FOOT SURGERY Left     No Known Allergies  There is no immunization history for the selected administration types on file for this patient.  Family History  Problem Relation Age of Onset   Aneurysm Mother    Cancer Sister        Bone     Current Outpatient Medications:    albuterol (VENTOLIN HFA) 108 (90 Base) MCG/ACT inhaler, TAKE 2 PUFFS BY MOUTH EVERY 6 HOURS AS NEEDED FOR WHEEZE OR SHORTNESS OF BREATH, Disp: 8.5 each, Rfl: 2   clonazePAM (KLONOPIN) 1 MG tablet, TAKE 1/2 TO 1 TABLET (0.5-1 MG TOTAL) BY MOUTH DAILY., Disp: 30 tablet, Rfl: 0   Fluticasone-Umeclidin-Vilant (TRELEGY ELLIPTA) 100-62.5-25 MCG/ACT AEPB, Inhale 1 puff into the lungs daily., Disp: 1 each, Rfl: 11   gabapentin (NEURONTIN) 100 MG capsule, Take 1 capsule (100 mg total) by mouth 3 (three) times daily., Disp: 180 capsule, Rfl: 4   hydrochlorothiazide (MICROZIDE) 12.5 MG capsule, Take 1 capsule (12.5 mg total) by mouth daily., Disp: 30 capsule, Rfl: 0   losartan (COZAAR) 100 MG tablet, TAKE 1 TABLET BY MOUTH EVERY DAY, Disp: 30 tablet, Rfl: 1   Phenylephrine-Acetaminophen (DAYTIME SINUS CONGESTION PO), Take 325 mg by mouth daily., Disp: , Rfl:    rosuvastatin (CRESTOR) 10 MG tablet, Take 1 tablet (10 mg total) by mouth at bedtime., Disp: 90 tablet, Rfl: 1   senna-docusate (SENOKOT-S) 8.6-50 MG tablet, Take 1 tablet by mouth daily., Disp: , Rfl:       Objective:   Vitals:   11/20/22 0845  BP: (!) 150/88  Pulse: (!) 55  Temp: 98.2 F (36.8 C)  TempSrc: Oral  SpO2: 97%  Weight: 191 lb 9.6 oz (86.9 kg)  Height: 6\' 3"  (1.905 m)    Estimated body mass index is 23.95 kg/m as calculated from the following:   Height as of this encounter: 6\' 3"  (1.905 m).   Weight as of this encounter: 191 lb 9.6 oz (86.9 kg).  @WEIGHTCHANGE @  [employer]   11/20/22 0845  Weight: 191 lb 9.6 oz (86.9 kg)     Physical  Exam  General: No distress. Looks well Neuro: Alert and Oriented x 3. GCS 15. Speech normal Psych: Pleasant Resp:  Barrel Chest - no.  Wheeze - no, Crackles - no, No overt respiratory distress CVS: Normal heart sounds. Murmurs - no Ext: Stigmata of Connective Tissue Disease - no HEENT: Normal upper airway. PEERL +. No post nasal drip        Assessment:       ICD-10-CM   1. Chronic cough  R05.3     2. DOE (dyspnea on exertion)  R06.09     3. Chronic nasal congestion  R09.81     4. Smoking greater than 40 pack years  F17.210     5. Abnormal  CT of the chest  R93.89          Plan:     Patient Instructions     ICD-10-CM   1. Chronic cough  R05.3     2. DOE (dyspnea on exertion)  R06.09     3. Chronic nasal congestion  R09.81     4. Smoking greater than 40 pack years  F17.210     5. Abnormal CT of the chest  R93.89      You might have an infection called MAI -this is a low-grade noncontagious infection. Apart from this he might also have chronic sinusitis You are at risk for lung cancer but currently do not have lung cancer You might have COPD or chronic bronchitis  Plan -Do blood work for ANA, rheumatoid factor, CCP and QuantiFERON gold -Do CT scan of the sinus without contrast -Continue Trelegy inhaler for now -Schedule bronchoscopy with lavage -aim for December 01, 2022 Friday at [facility] -Do full pulmonary function test  Follow-up - Return to see nurse practitioner to at the end of May 2024      SIGNATURE    Dr. Kalman Shan, M.D., F.C.C.P,  Pulmonary and Critical Care Medicine Staff Physician, [facility] Center Director - Interstitial Lung Disease  Program  Pulmonary Fibrosis [facility] Network at [facility] [place], [place], [address]  Pager: [phone], If no answer or between  15:00h - 7:00h: call [phone] Telephone: [phone]  9:07 AM 11/20/2022

## 2022-11-21 ENCOUNTER — Ambulatory Visit (INDEPENDENT_AMBULATORY_CARE_PROVIDER_SITE_OTHER): Payer: Medicare HMO | Admitting: Family Medicine

## 2022-11-21 ENCOUNTER — Encounter: Payer: Self-pay | Admitting: Family Medicine

## 2022-11-21 VITALS — BP 132/62 | HR 68 | Temp 98.2°F | Ht 75.0 in | Wt 199.1 lb

## 2022-11-21 DIAGNOSIS — Z0000 Encounter for general adult medical examination without abnormal findings: Secondary | ICD-10-CM | POA: Diagnosis not present

## 2022-11-21 NOTE — Patient Instructions (Addendum)
Annual Wellness Visit completed today.  Please review the information provided to you about Advance Directive. If you have further questions, concerns, or needs help with completing these documents, call back to office.  Continue all medications.  EKG has changed, recommend a cardiology referral, but declines at this time. If you change your mind, please call back to the office. Will send in a referral.  Encouraged to pneumonia, shingles, tdap vaccine at your local pharmacy. Declines referral to GI for colonoscopy. EK

## 2022-11-21 NOTE — Progress Notes (Signed)
Subjective:   Jerry Spears. is a 72 y.o. male who presents for a Welcome to Medicare exam.   Review of Systems: Cardiac Risk Factors include: hypertension     Objective:    Today's Vitals   11/21/22 1013  BP: 132/62  Pulse: 68  Temp: 98.2 F (36.8 C)  SpO2: 98%  Weight: 199 lb 2 oz (90.3 kg)  Height:  (1.905 m)   Body mass index is 24.89 kg/m.  Medications Outpatient Encounter Medications as of 11/21/2022  Medication Sig   albuterol (VENTOLIN HFA) 108 (90 Base) MCG/ACT inhaler TAKE 2 PUFFS BY MOUTH EVERY 6 HOURS AS NEEDED FOR WHEEZE OR SHORTNESS OF BREATH   clonazePAM (KLONOPIN) 1 MG tablet TAKE 1/2 TO 1 TABLET (0.5-1 MG TOTAL) BY MOUTH DAILY.   Fluticasone-Umeclidin-Vilant (TRELEGY ELLIPTA) 100-62.5-25 MCG/ACT AEPB Inhale 1 puff into the lungs daily.   gabapentin (NEURONTIN) 100 MG capsule Take 1 capsule (100 mg total) by mouth 3 (three) times daily.   hydrochlorothiazide (MICROZIDE) 12.5 MG capsule Take 1 capsule (12.5 mg total) by mouth daily.   losartan (COZAAR) 100 MG tablet TAKE 1 TABLET BY MOUTH EVERY DAY   Phenylephrine-Acetaminophen (DAYTIME SINUS CONGESTION PO) Take 325 mg by mouth daily.   rosuvastatin (CRESTOR) 10 MG tablet Take 1 tablet (10 mg total) by mouth at bedtime.   senna-docusate (SENOKOT-S) 8.6-50 MG tablet Take 1 tablet by mouth daily.   No facility-administered encounter medications on file as of 11/21/2022.     History: Past Medical History:  Diagnosis Date   Anxiety    Callus    Chronic back pain    Constipation    Corn of foot    DDD (degenerative disc disease), lumbar    Depression    Hypertension    Lung disease    Past Surgical History:  Procedure Laterality Date   FOOT SURGERY Left     Family History  Problem Relation Age of Onset   Aneurysm Mother    Cancer Sister        Bone   Social History   Occupational History   Not on file  Tobacco Use   Smoking status: Every Day    Packs/day: 1.00    Years: 56.00     Additional pack years: 0.00    Total pack years: 56.00    Types: Cigarettes   Smokeless tobacco: Never  Vaping Use   Vaping Use: Never used  Substance and Sexual Activity   Alcohol use: Not Currently   Drug use: No   Sexual activity: Not Currently    Birth control/protection: None   Tobacco Counseling Ready to quit: No Counseling given: No   Immunizations and Health Maintenance There is no immunization history for the selected administration types on file for this patient. Health Maintenance Due  Topic Date Due   Pneumonia Vaccine 70+ Years old (1 of 2 - PCV) Never done   DTaP/Tdap/Td (1 - Tdap) Never done   COLONOSCOPY (Pts 45-76yrs Insurance coverage will need to be confirmed)  Never done   Zoster Vaccines- Shingrix (1 of 2) Never done    Activities of Daily Living    11/21/2022   10:57 AM 11/21/2022   10:09 AM  In your present state of health, do you have any difficulty performing the following activities:  Hearing?  1  Comment Would like to try ear lavage at next appointment.   Vision?  1  Comment Recommend yearly ophthalmology eye exams   Difficulty  concentrating or making decisions?  1  Walking or climbing stairs?  1  Comment Declines to have assistance with help placing hand rails.   Dressing or bathing? 0 1  Doing errands, shopping? 0 1    Physical Exam:  General: He is not in acute distress. Appearance: Normal appearance. He is not ill-appearing or diaphoretic.  Cardiovascular: Rate and rhythm is normal.  Heart sounds:Normal heart sounds. No murmur heard.  Pulmonary:  Effort: Pulmonary effort is normal. No respiratory distress. Breath sounds: Clear anterior and posterior.  Skin: Warm and dry Neurological: Alert, oriented to person, place, and time.  Psychiatric: Mood normal. Behavior normal. Judgement normal.   Advanced Directives: Does Patient Have a Medical Advance Directive?: No Would patient like information on creating a medical advance  directive?: Yes (MAU/Ambulatory/Procedural Areas - Information given)   Does Patient Have a Mental Health Advance Directive?: No Would patient like information on creating a mental health advance directive?: Yes (MAU/Ambulatory/Procedural Areas-Information given) Assessment:    This is a routine wellness  examination for this patient .   Vision/Hearing screen Hearing Screening     Right ear heard  Left ear heard   Vision Screening   Right eye Left eye Both eyes  Without correction 20/30 20/20 1 20/30  With correction       Dietary issues and exercise activities discussed:  Current Exercise Habits: The patient does not participate in regular exercise at present (Working out in the yard), Time (Minutes): 10, Frequency (Times/Week): 2, Weekly Exercise (Minutes/Week): 20, Intensity: Mild, Exercise limited by: orthopedic condition(s)   Goals      Make and Keep All Appointments     Timeframe:  Short-Term Goal Priority:  Medium Start Date:                             Expected End Date:                       Follow Up Date 04 /08/2023    - call to cancel if needed  -Keep appointments -Keep Calendar for appointment.   Why is this important?   Part of staying healthy is seeing the doctor for follow-up care.  If you forget your appointments, there are some things you can do to stay on track.    Notes:         Depression Screen    11/21/2022   10:10 AM 11/01/2022    8:56 AM 10/13/2022   10:45 AM 12/07/2021   10:54 AM  PHQ 2/9 Scores  PHQ - 2 Score 0 0 1 0  PHQ- 9 Score 0 4 4 0     Fall Risk    11/21/2022   10:55 AM  Fall Risk   Falls in the past year? 0  Number falls in past yr: 0  Injury with Fall? 0    Cognitive Function    11/21/2022    1:55 PM  MMSE - Mini Mental State Exam  Orientation to time 5  Orientation to Place 5  Registration 3  Attention/ Calculation 5  Recall 3  Language- name 2 objects 2  Language- repeat 1  Language- follow 3 step  command 3  Language- read & follow direction 1  Write a sentence 1  Copy design 1  Total score 30       Patient Care Team: Alveria Apley, NP as PCP - General (Family Medicine) Regal,  Kirstie Peri, DPM as Consulting Physician (Podiatry) Kalman Shan, MD as Consulting Physician (Pulmonary Disease)  Patient is aware he has an upcoming visit with Dr. Gwynn Burly, Infectious Disease, on 11/30/2022.     Plan:   I have personally reviewed and noted the following in the patient's chart:   Medical and social history Use of alcohol, tobacco or illicit drugs  Current medications and supplements Functional ability and status Nutritional status Physical activity Advanced directives List of other physicians Hospitalizations, surgeries, and ER visits in previous 12 months Vitals Screenings to include cognitive, depression, and falls Referrals and appointments  In addition, I have reviewed and discussed with patient certain preventive protocols, quality metrics, and best practice recommendations. A written personalized care plan for preventive services as well as general preventive health recommendations were provided to patient.   EKG was obtained for screening since last EKG was in 2021. 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 No ST elevation. Different in lead II, V2 and V3 compared to 07/06/2020. Initially declined cardiology referral in office, but patient has a cardiology appointment in May with Dr. Katherina Right based on abnormal findings on the CT lung screening. Will remind patient of his appointment at his next visit on 04/16.   Offered to place a referral care management services to help with placing side rails at his stairs, with climbing stairs to his home, but he declined.   Encouraged PNA, Shingrix, and Tdap vaccine at his local pharmacy.  Continues to decline referral to GI for colonoscopy.  Zandra Abts, NP 11/21/2022

## 2022-11-22 LAB — QUANTIFERON-TB GOLD PLUS
Mitogen-NIL: 8.39 IU/mL
NIL: 0.02 IU/mL
QuantiFERON-TB Gold Plus: NEGATIVE
TB1-NIL: 0 IU/mL
TB2-NIL: 0 IU/mL

## 2022-11-22 LAB — RHEUMATOID FACTOR: Rheumatoid fact SerPl-aCnc: 13 IU/mL (ref <14)

## 2022-11-22 LAB — CYCLIC CITRUL PEPTIDE ANTIBODY, IGG: Cyclic Citrullin Peptide Ab: <16 UNITS

## 2022-11-22 LAB — ANA: Anti Nuclear Antibody (ANA): POSITIVE — AB

## 2022-11-22 LAB — ANTI-NUCLEAR AB-TITER (ANA TITER): ANA Titer 1: 1:40 {titer} — ABNORMAL HIGH

## 2022-11-30 ENCOUNTER — Ambulatory Visit: Payer: Medicare HMO | Admitting: Internal Medicine

## 2022-11-30 ENCOUNTER — Telehealth: Payer: Self-pay | Admitting: Internal Medicine

## 2022-11-30 ENCOUNTER — Telehealth: Payer: Self-pay

## 2022-11-30 NOTE — Telephone Encounter (Signed)
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  He has bronchoscopy scheduled for 4/26.  Can we call him and advise that his appt would probably be more beneficial in 2-3 weeks once we have results of bronchoscopy to help inform decision making.  I am happy to see him tmrw but until we have the results of bronchoscopy I don't think the visit will be very productive.   Jerry Spears

## 2022-11-30 NOTE — Telephone Encounter (Signed)
Patient would like to cancel Bronch procedure for 12/01/2022. Patient phone number is [phone].

## 2022-11-30 NOTE — Telephone Encounter (Signed)
Called and spoke with pt who states he is wanting to cancel the bronch that was scheduled for 4/26. Pt said he first did not realize that it was to be scheduled for 4/26.  Pt said he had gotten the xray done as recommended by the provider and said after the xray was done, no cancer or TB was seen from the xray and pt does not want to have the bronch performed.  Routing to procedure pool as well as Dr. Marchelle Gearing to let them know about the procedure needing to be cancelled. Pt does not wish to reschedule it.

## 2022-11-30 NOTE — Telephone Encounter (Signed)
Spoke to MR and made him aware of message.  He will let me know how to proceed - will not cancel until he tells me to.

## 2022-11-30 NOTE — Telephone Encounter (Signed)
Bronchoscopy is canceled according to patient wishes

## 2022-11-30 NOTE — Telephone Encounter (Signed)
That is fine and his desire and rights will be respected.  No problem at all.  We will continue to support his care.  He can proceed with the rest of the plan and see Rhunette Croft at the end of May 2024.  I am here to assist him and guide him in his care plan per his wishes.  Please let him know about all that

## 2022-11-30 NOTE — Progress Notes (Signed)
Spoke to patient on the phone for SDW for surgery on 12/01/2022.  States he does not want to have surgery, wants to cancel as he is feeling fine and does not feel like he needs it.  Instructed patient to call Dr. Jane Canary office and gave patient his phone number.

## 2022-11-30 NOTE — Telephone Encounter (Signed)
Attempted to call patient regarding provider's message about appointment. Not able to reach him at this time. Voicemail is not setup.  Juanita Laster, RMA

## 2022-12-01 ENCOUNTER — Ambulatory Visit (INDEPENDENT_AMBULATORY_CARE_PROVIDER_SITE_OTHER): Payer: Medicare HMO | Admitting: Family Medicine

## 2022-12-01 ENCOUNTER — Encounter: Payer: Self-pay | Admitting: Family Medicine

## 2022-12-01 ENCOUNTER — Ambulatory Visit (HOSPITAL_COMMUNITY): Admission: RE | Admit: 2022-12-01 | Payer: Medicare HMO | Source: Home / Self Care | Admitting: Internal Medicine

## 2022-12-01 VITALS — BP 134/68 | HR 74 | Temp 97.9°F | Ht 75.0 in | Wt 189.1 lb

## 2022-12-01 DIAGNOSIS — J984 Other disorders of lung: Secondary | ICD-10-CM

## 2022-12-01 DIAGNOSIS — I10 Essential (primary) hypertension: Secondary | ICD-10-CM

## 2022-12-01 SURGERY — VIDEO BRONCHOSCOPY WITHOUT FLUORO
Anesthesia: General

## 2022-12-01 NOTE — Addendum Note (Signed)
Addended by: Alveria Apley on: 12/01/2022 08:23 PM   Modules accepted: Level of Service

## 2022-12-01 NOTE — Progress Notes (Signed)
Established Patient Office Visit   Subjective:  Patient ID: Jerry Spears., male    DOB: [birth year]  Age: 72 y.o. MRN: [id number]  Chief Complaint  Patient presents with   Hypertension    Pt is here today to F/U on B/P. Pt reports he does not have B/P cuff. Pt does not have his reading with him. Pt denies chest pain Pt reports soreness on bottom of his feet.    HPI HTN: Patient is taking Losartan 100mg  daily and added HCTZ 12.5mg  in the AM on his last visit, 11/17/2022. He didn't bring in his readings and reports he has took his medication this morning. He reports he has always had soreness in his feet, but it is a little worse since starting HCTZ. Denies chest pain, headaches, dizziness, and lightheadedness. Reports his shortness of breath is no more than his usual that he has had.    Patient was initially seen on 11/20/2022 by Dr. Kalman Shan with [facility] for abnormal findings on his low dose CT lung screening with concern about MAI. Also, he has been experiencing more shortness of breath on exertion in the last few months and had a cough. The plan was to have a bronchoscopy with lavage. However, patient has declined the bronchoscopy that was actually scheduled for today.  Also, he expressed that he declines to see infectious diease, but agrees to going for his follow up with pulmonary in May.  ROS See HPI above     Objective:   BP 134/68 (BP Location: Left Arm, Cuff Size: Normal)   Pulse 74   Temp 97.9 F (36.6 C)   Ht 6\' 3"  (1.905 m)   Wt 189 lb 2 oz (85.8 kg)   SpO2 95%   BMI 23.64 kg/m    Physical Exam Vitals reviewed.  Constitutional:      General: He is not in acute distress.    Appearance: Normal appearance. He is not ill-appearing, toxic-appearing or diaphoretic.  Eyes:     General:        Right eye: No discharge.        Left eye: No discharge.     Conjunctiva/sclera: Conjunctivae normal.  Cardiovascular:     Rate and Rhythm:  Normal rate and regular rhythm.     Heart sounds: Normal heart sounds. No murmur heard.    No friction rub. No gallop.  Pulmonary:     Effort: Pulmonary effort is normal. No respiratory distress.     Breath sounds: Normal breath sounds.  Musculoskeletal:        General: Normal range of motion.     Right lower leg: No edema.     Left lower leg: No edema.  Skin:    General: Skin is warm and dry.  Neurological:     General: No focal deficit present.     Mental Status: He is alert and oriented to person, place, and time. Mental status is at baseline.  Psychiatric:        Mood and Affect: Mood normal.        Behavior: Behavior normal.        Thought Content: Thought content normal.        Judgment: Judgment normal.      Assessment & Plan:  Essential hypertension Assessment & Plan: Initial blood pressure is elevated today. Uncertain what he his blood pressures are at home. Offered to change the HCTZ 12.5mg  to another blood pressure medications since patient reports  his feet have been more sore than normal. He declined to change blood pressure medications and would like to stay on Losartan 100mg  daily and Hydrochlorothiazide 12.5mg  daily for another 2 weeks. Strongly encouraged him to get an upper arm blood pressure cuff machine. Recommend to monitor his blood pressure BID and bring to his appointment in 2 weeks. Also, encouraged to go to his cardiology appointment in May. Will need a BMP on next visit to assess his kidney function and potassium with adding HCTZ.    Lung disease  -Discussed about the risk of not following recommendations from pulmonary. Discussed the importance of having a bronchoscopy to see if there is an infection in his lungs. Recommend to think about going forth with further assessment of his lungs, especially with having increased of SHOB on exertion and cough. Advised if he changes his mind, please contact pulmonary.   Return in about 2 weeks (around 12/15/2022) for  follow-up: Blood pressure .   Zandra Abts, NP

## 2022-12-01 NOTE — Assessment & Plan Note (Addendum)
Initial blood pressure is elevated today. Uncertain what he his blood pressures are at home. Offered to change the HCTZ 12.5mg  to another blood pressure medications since patient reports his feet have been more sore than normal. He declined to change blood pressure medications and would like to stay on Losartan 100mg  daily and Hydrochlorothiazide 12.5mg  daily for another 2 weeks. Strongly encouraged him to get an upper arm blood pressure cuff machine. Recommend to monitor his blood pressure BID and bring to his appointment in 2 weeks. Also, encouraged to go to his cardiology appointment in May. Will need a BMP on next visit to assess his kidney function and potassium with adding HCTZ.

## 2022-12-01 NOTE — Telephone Encounter (Signed)
Called patient but he did not answer. His VM is not setup. Will call back later.

## 2022-12-01 NOTE — Patient Instructions (Signed)
-  Blood pressure is elevated today. You declined to change blood pressure medications and would like to stay on Losartan 100mg  daily and Hydrochlorothiazide 12.5mg  daily for another 2 weeks. Strongly encourage you to get an upper arm blood pressure cuff machine. Recommend to take your blood pressure twice a day, write them down on the form provided, and bring to your next appointment.  -You have declined bronchoscopy with pulmonary and declined an appointment with infectious disease. Discussed about risk of not following recommendations from pulmonary and recommend to give further treatment a thought about going forth with further assessment of your lungs. If you change your mind, please contact pulmonary.  -Encouraged to go to your upcoming cardiology (heart) appointment and pulmonary (lung) appointment in May.  -Follow up in 2 weeks for blood pressure management.

## 2022-12-11 ENCOUNTER — Other Ambulatory Visit (HOSPITAL_COMMUNITY): Payer: Medicare HMO

## 2022-12-12 ENCOUNTER — Other Ambulatory Visit: Payer: Self-pay

## 2022-12-12 ENCOUNTER — Other Ambulatory Visit: Payer: Self-pay | Admitting: Family Medicine

## 2022-12-12 DIAGNOSIS — I10 Essential (primary) hypertension: Secondary | ICD-10-CM

## 2022-12-12 MED ORDER — HYDROCHLOROTHIAZIDE 12.5 MG PO CAPS
12.5000 mg | ORAL_CAPSULE | Freq: Every day | ORAL | 0 refills | Status: DC
Start: 2022-12-12 — End: 2023-03-15

## 2022-12-12 NOTE — Telephone Encounter (Signed)
This was a duplicate request and It was sent in

## 2022-12-15 ENCOUNTER — Other Ambulatory Visit: Payer: Medicare HMO

## 2022-12-15 ENCOUNTER — Ambulatory Visit (INDEPENDENT_AMBULATORY_CARE_PROVIDER_SITE_OTHER): Payer: Medicare HMO | Admitting: Family Medicine

## 2022-12-15 ENCOUNTER — Encounter: Payer: Self-pay | Admitting: Family Medicine

## 2022-12-15 VITALS — BP 140/68 | HR 65 | Temp 98.6°F | Ht 75.0 in | Wt 192.4 lb

## 2022-12-15 DIAGNOSIS — I10 Essential (primary) hypertension: Secondary | ICD-10-CM | POA: Diagnosis not present

## 2022-12-15 DIAGNOSIS — Z09 Encounter for follow-up examination after completed treatment for conditions other than malignant neoplasm: Secondary | ICD-10-CM

## 2022-12-15 NOTE — Progress Notes (Signed)
Established Patient Office Visit   Subjective:  Patient ID: Jerry Bloedorn., male    DOB: 1951/05/10  Age: 72 y.o. MRN: [id number]  Chief Complaint  Patient presents with   Follow-up    Pt reports he has not been checking B/P Pt reports he does not have a cuff. Pt has Levocetirizine 5 mg with him today, he reports he has been taking this and unsure what it is for. Pt has Lexapro with him today and reports he is unsure why he has it.    HPI HTN: Chronic. Patient is taking Losartan 100mg  daily and HCTZ 12.5mg  daily. On previous visit, he didn't bring readings in and his blood pressure was initially elevated on previous visit. He was complaining of having a little more soreness in his feet with starting HCTZ on 11/17/2022. Offered to change the HCTZ 12.5mg  to another blood pressure medication, but he declined. He wanted to keep on taking both medications. Encouraged him to get an upper blood pressure machine and monitor his blood pressure 2 more weeks. Patient didn't get a blood pressure cuff. He denies any symptoms with taking medication.   ROS See HPI above     Objective:   BP (!) 140/68 (BP Location: Right Arm, Cuff Size: Normal)   Pulse 65   Temp 98.6 F (37 C)   Ht 6\' 3"  (1.905 m)   Wt 192 lb 6 oz (87.3 kg)   SpO2 96%   BMI 24.05 kg/m    Physical Exam Vitals reviewed.  Constitutional:      General: He is not in acute distress.    Appearance: Normal appearance. He is not ill-appearing, toxic-appearing or diaphoretic.  HENT:     Head: Normocephalic and atraumatic.  Eyes:     General:        Right eye: No discharge.        Left eye: No discharge.     Conjunctiva/sclera: Conjunctivae normal.  Cardiovascular:     Rate and Rhythm: Normal rate and regular rhythm.     Heart sounds: Normal heart sounds. No murmur heard.    No friction rub. No gallop.  Pulmonary:     Effort: Pulmonary effort is normal. No respiratory distress.     Breath sounds: Normal breath  sounds.  Musculoskeletal:        General: Normal range of motion.     Right lower leg: No edema.     Left lower leg: No edema.  Skin:    General: Skin is warm and dry.  Neurological:     General: No focal deficit present.     Mental Status: He is alert and oriented to person, place, and time. Mental status is at baseline.  Psychiatric:        Mood and Affect: Mood normal.        Behavior: Behavior normal.        Thought Content: Thought content normal.        Judgment: Judgment normal.      Assessment & Plan:  Essential hypertension Assessment & Plan: Blood pressure is initially elevated on visits but decreases during the visit. Think that could be some from white coat syndrome. Unclear what his blood pressures are at home due to not getting a cuff and monitoring them. Continue with Losartan 100mg  and HCTZ 12.5mg  daily. Ordered BMP to assess kidney function and potassium with starting HCTZ on 11/17/2022.   Orders: -     Basic metabolic panel  Follow-up  exam  -Encouraged patient to monitor go to his cardiology and pulmonary appointments that are coming up.  -In one month will follow up on all chronic management.  -Patient brought in medications that he states he is taking but were not on his list. Added medication and explained the reason for the medications.   Return in about 1 month (around 01/15/2023) for chronic management.   Zandra Abts, NP

## 2022-12-15 NOTE — Patient Instructions (Signed)
-  Continue with Losartan 100mg  daily and Hydrochlorothiazide 12.5mg  daily.  -Encourage you to go to your cardiology appointment and pulmonary appointment.  -Ordered labs to check your kidney function and potassium level. Office will call with lab results and you may see results on MyChart.  -Follow up in 1 month for chronic management.

## 2022-12-15 NOTE — Assessment & Plan Note (Addendum)
Blood pressure is initially elevated on visits but decreases during the visit. Think that could be some from white coat syndrome. Unclear what his blood pressures are at home due to not getting a cuff and monitoring them. Continue with Losartan 100mg  and HCTZ 12.5mg  daily. Ordered BMP to assess kidney function and potassium with starting HCTZ on 11/17/2022.

## 2022-12-18 ENCOUNTER — Other Ambulatory Visit: Payer: Self-pay | Admitting: Family Medicine

## 2022-12-18 DIAGNOSIS — F419 Anxiety disorder, unspecified: Secondary | ICD-10-CM

## 2022-12-20 ENCOUNTER — Other Ambulatory Visit: Payer: Medicare HMO

## 2022-12-20 NOTE — Progress Notes (Signed)
Done

## 2022-12-26 ENCOUNTER — Ambulatory Visit: Payer: Medicare HMO | Attending: Cardiovascular Disease | Admitting: Cardiovascular Disease

## 2022-12-29 ENCOUNTER — Encounter (HOSPITAL_BASED_OUTPATIENT_CLINIC_OR_DEPARTMENT_OTHER): Payer: Medicare HMO

## 2023-01-02 ENCOUNTER — Ambulatory Visit: Payer: Medicare HMO | Admitting: Nurse Practitioner

## 2023-01-10 ENCOUNTER — Other Ambulatory Visit: Payer: Medicare HMO

## 2023-01-12 NOTE — Progress Notes (Deleted)
   Established Patient Office Visit   Subjective:  Patient ID: Jerry Spears., male    DOB: 11-18-1950  Age: 72 y.o. MRN: [id number]  No chief complaint on file.   HPI Anxiety: Patient is taking Lexapro 10mg  daily and Clonazepam 0.5-1mg  daily.   ROS See HPI above     Objective:     There were no vitals taken for this visit. {Vitals History (Optional):23777}  Physical Exam  No results found for any visits on 01/15/23.  The 10-year ASCVD risk score (Arnett DK, et al., 2019) is: 30%    Assessment & Plan:  Essential hypertension  Anxiety    No follow-ups on file.   Zandra Abts, NP

## 2023-01-15 ENCOUNTER — Ambulatory Visit: Payer: Medicare HMO | Admitting: Family Medicine

## 2023-01-15 DIAGNOSIS — I10 Essential (primary) hypertension: Secondary | ICD-10-CM

## 2023-01-15 DIAGNOSIS — F419 Anxiety disorder, unspecified: Secondary | ICD-10-CM

## 2023-01-19 ENCOUNTER — Telehealth: Payer: Self-pay

## 2023-01-19 ENCOUNTER — Ambulatory Visit (INDEPENDENT_AMBULATORY_CARE_PROVIDER_SITE_OTHER): Payer: Medicare HMO | Admitting: Family Medicine

## 2023-01-19 ENCOUNTER — Encounter: Payer: Self-pay | Admitting: Family Medicine

## 2023-01-19 VITALS — BP 168/80 | HR 70 | Temp 98.2°F | Ht 75.0 in | Wt 193.5 lb

## 2023-01-19 DIAGNOSIS — Z139 Encounter for screening, unspecified: Secondary | ICD-10-CM

## 2023-01-19 DIAGNOSIS — I2583 Coronary atherosclerosis due to lipid rich plaque: Secondary | ICD-10-CM | POA: Diagnosis not present

## 2023-01-19 DIAGNOSIS — F419 Anxiety disorder, unspecified: Secondary | ICD-10-CM | POA: Diagnosis not present

## 2023-01-19 DIAGNOSIS — I2510 Atherosclerotic heart disease of native coronary artery without angina pectoris: Secondary | ICD-10-CM | POA: Diagnosis not present

## 2023-01-19 DIAGNOSIS — J449 Chronic obstructive pulmonary disease, unspecified: Secondary | ICD-10-CM | POA: Diagnosis not present

## 2023-01-19 DIAGNOSIS — R825 Elevated urine levels of drugs, medicaments and biological substances: Secondary | ICD-10-CM

## 2023-01-19 DIAGNOSIS — I10 Essential (primary) hypertension: Secondary | ICD-10-CM | POA: Diagnosis not present

## 2023-01-19 DIAGNOSIS — R053 Chronic cough: Secondary | ICD-10-CM | POA: Diagnosis not present

## 2023-01-19 MED ORDER — CLONAZEPAM 1 MG PO TABS
ORAL_TABLET | ORAL | 0 refills | Status: DC
Start: 2023-01-19 — End: 2024-01-09

## 2023-01-19 MED ORDER — TRELEGY ELLIPTA 100-62.5-25 MCG/ACT IN AEPB
1.0000 | INHALATION_SPRAY | Freq: Every day | RESPIRATORY_TRACT | 11 refills | Status: AC
Start: 2023-01-19 — End: ?

## 2023-01-19 MED ORDER — ALBUTEROL SULFATE HFA 108 (90 BASE) MCG/ACT IN AERS
INHALATION_SPRAY | RESPIRATORY_TRACT | 2 refills | Status: DC
Start: 2023-01-19 — End: 2023-12-20

## 2023-01-19 NOTE — Assessment & Plan Note (Addendum)
Blood pressure is not controlled. Continue with Losartan 100mg  daily and HCTZ 12.5mg  daily. Will ask Tonya, CMA call patient to see if patient will start Amlodipine 2.5mg  tablet, 1 tablet daily. If he has a blood pressure cuff, he needs to monitor his blood pressure BID and follow up in 2 weeks. He also, needs a CMP.

## 2023-01-19 NOTE — Telephone Encounter (Signed)
   Telephone encounter was:  Unsuccessful.  01/19/2023 Name: Jerry Spears. MRN: [id number] DOB: 1951/04/16  Unsuccessful outbound call made today to assist with:  Transportation Needs   Outreach Attempt:  1st Attempt  Unable to leave message, voicemail not set-up.  Lillymae Duet [facility]  [facility] Population Health Community Resource Care Guide   [email or web address]  ?? [phone]   Website: [email or web address]  [email or web address]

## 2023-01-19 NOTE — Progress Notes (Signed)
Established Patient Office Visit   Subjective:  Patient ID: Jerry Friedley., male    DOB: 04-25-1951  Age: 72 y.o. MRN: [id number]  Chief Complaint  Patient presents with   Follow-up    Pt is FASTING UDS done in office Pt has some concerns with transportation back and forth to doctor. Pt has concern with paying for his medications.    HPI Chronic cough: Patient is prescribed Albuterol Inhaler as needed and Trelegy as a maintenance daily inhaler. He reports he has not been using his Trelegy inhaler due to cost. Also, reports he has been using his Albuterol Inhaler as every night before bed.   Anxiety/Mood: Patient is prescribed and reports he is taking Escitalopram 10mg  daily and Clonazepam 1mg , 1/2 to 1 tablets daily. He reports these medications are effective.   Patient has missed several appointments with pulmonary and establishing care with cardiology. He reports he does not have the financial resources to get some of his medications and gas money for appointments. He reports he has been out of Trelegy due to not having financial resources to cover co-pay cost.  HTN: Chronic. Patient reports he is taking HCTZ 12.5mg  daily and Losartan 100mg  daily. Blood pressure is elevated today, denies any symptoms.  BP Readings from Last 3 Encounters:  01/19/23 (!) 168/80  12/15/22 (!) 140/68  12/01/22 134/68    Hyperlipidemia: Chronic. Patient is taking Rosuvastatin 10mg  daily. Denies any complications.  Lab Results  Component Value Date   CHOL 159 11/16/2021   HDL 43.40 11/16/2021   LDLCALC 91 11/16/2021   TRIG 124.0 11/16/2021   CHOLHDL 4 11/16/2021    ROS See HPI above     Objective:   BP (!) 168/80 (BP Location: Left Arm, Cuff Size: Normal)   Pulse 70   Temp 98.2 F (36.8 C)   Ht 6\' 3"  (1.905 m)   Wt 193 lb 8 oz (87.8 kg)   SpO2 98%   BMI 24.19 kg/m    Physical Exam Vitals reviewed.  Constitutional:      General: He is not in acute distress.    Appearance:  Normal appearance. He is not ill-appearing, toxic-appearing or diaphoretic.  Eyes:     General:        Right eye: No discharge.        Left eye: No discharge.     Conjunctiva/sclera: Conjunctivae normal.  Cardiovascular:     Rate and Rhythm: Normal rate and regular rhythm.     Heart sounds: Normal heart sounds. No murmur heard.    No friction rub. No gallop.  Pulmonary:     Effort: Pulmonary effort is normal. No respiratory distress.     Breath sounds: Wheezing (Mild-posterior) present.  Musculoskeletal:        General: Normal range of motion.  Skin:    General: Skin is warm and dry.  Neurological:     General: No focal deficit present.     Mental Status: He is alert and oriented to person, place, and time. Mental status is at baseline.  Psychiatric:        Mood and Affect: Mood normal.        Behavior: Behavior normal.        Thought Content: Thought content normal.        Judgment: Judgment normal.      Assessment & Plan:  Anxiety Assessment & Plan: Stable. Continue with Escitalopram 10mg  daily and Clonazepam 1mg  daily. Patient had a POCT UDS that  was positive for MOP and Benzo. Reviewed PDMP with no prescriptions to support the abnormal positive drug screen. Will send urine to main lab for confirmation. Had a long discussion with patient his UDS results. Patient denies using any other medications except Clonazepam. He reports he used "pain pills" previously, but not recently. Informed patient if he had another positive UDS besides what is prescribed, this provider would no longer being able to refill and manage his Clonazepam prescription. He will need to be referred to psych.   Orders: -     clonazePAM; TAKE 1/2 TO 1 TABLET (0.5-1 MG TOTAL) BY MOUTH DAILY.  Dispense: 30 tablet; Refill: 0 -     POCT Urine drug screen -     [id number] 11+Oxyco+Alc+Crt-Bund  Chronic cough Assessment & Plan: Refilled Albuterol Inhaler and Trelegy. Informed when he received his Trelegy inhaler, he  needed to sue that inhaler every day and only use the Albuterol Inhaler as needed.   Orders: -     Albuterol Sulfate HFA; TAKE 2 PUFFS BY MOUTH EVERY 6 HOURS AS NEEDED FOR WHEEZE OR SHORTNESS OF BREATH  Dispense: 8.5 each; Refill: 2 -     Trelegy Ellipta; Inhale 1 puff into the lungs daily.  Dispense: 1 each; Refill: 11  Positive urine drug screen -     POCT Urine drug screen -     [id number] 11+Oxyco+Alc+Crt-Bund  Encounter for screening involving social determinants of health (SDoH) -     AMB Referral to Community Care Coordinaton ([facility] Patients)  Essential hypertension Assessment & Plan: Blood pressure is not controlled. Continue with Losartan 100mg  daily and HCTZ 12.5mg  daily. Will ask Tonya, CMA call patient to see if patient will start Amlodipine 2.5mg  tablet, 1 tablet daily. If he has a blood pressure cuff, he needs to monitor his blood pressure BID and follow up in 2 weeks. He also, needs a CMP.    Coronary artery disease due to lipid rich plaque Assessment & Plan: Patient is taking Rosuvastatin 10mg  daily. Patient left before having labs drawn for lipid. Will ask Tonya, CMA to call to have lipid panel drawn at his 2 weeks follow up with blood pressure.    1.Placed a referral to [facility] Coordination for financial strain and transportation concerns. Patient reports he is unable to obtain some of his medication because he does not have the money and he is missing medical appointments because he does not have the gas money.  2.Will keep the 3 month appointment for chronic management and ask for a 2 week follow up for blood pressure. 3.Recommend to follow up with pulmonary as scheduled.  4.Recommend to [facility] in [place], if you have problems with scheduling an appointment, please call the office.  [address], [place], [place] [address] [phone]  Return in about 3 months (around 04/21/2023) for follow-up.   Zandra Abts, NP

## 2023-01-19 NOTE — Telephone Encounter (Signed)
Called pt no answer vm not set up 

## 2023-01-19 NOTE — Patient Instructions (Addendum)
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 Sending urine for drug screen confirmation. Discussed with you that if you are positive again for another drug screen, this provider will no longer be able to prescribed Clonazpam and you will be referred to psych.  -Placed an order for social work coordination to help with transportation needs and financial strain of getting to appointments.  -Refilled Albuterol Inhaler and Trelegy. Please use Trelegy daily and only use Albuterol Inhaler as needed.  -Recommend to follow up with pulmonary as scheduled.  -Recommend to [facility] at [facility] in [place], if you have problems with scheduling an appointment, please call the office.  [address], [place], [place] [address] [phone] -Follow up in 3 months.

## 2023-01-19 NOTE — Telephone Encounter (Addendum)
   Telephone encounter was:  Successful.  01/19/2023 Name: Jerry Spears. MRN: [id number] DOB: 1951/07/20  Jerry Spears. is a 72 y.o. year old male who is a primary care patient of Alveria Apley, NP . The community resource team was consulted for assistance with Transportation Needs   Care guide performed the following interventions: Spoke with Rocky Link at [facility] [phone] to verify members transportation benefit. Patient has 24 one-way or 12 round trips. Must call 3 business days before appointments.  Follow Up Plan:   I will contact patient to inform him of his transportation benefit.  Ajanee Buren [facility]  [facility] Population Health Community Resource Care Guide   [email or web address]  ?? [phone]   Website: [email or web address]  [email or web address]

## 2023-01-19 NOTE — Assessment & Plan Note (Signed)
Refilled Albuterol Inhaler and Trelegy. Informed when he received his Trelegy inhaler, he needed to sue that inhaler every day and only use the Albuterol Inhaler as needed.

## 2023-01-19 NOTE — Assessment & Plan Note (Addendum)
Patient is taking Rosuvastatin 10mg  daily. Patient left before having labs drawn for lipid. Will ask Tonya, CMA to call to have lipid panel drawn at his 2 weeks follow up with blood pressure.

## 2023-01-19 NOTE — Assessment & Plan Note (Signed)
Stable. Continue with Escitalopram 10mg  daily and Clonazepam 1mg  daily. Patient had a POCT UDS that was positive for MOP and Benzo. Reviewed PDMP with no prescriptions to support the abnormal positive drug screen. Will send urine to main lab for confirmation. Had a long discussion with patient his UDS results. Patient denies using any other medications except Clonazepam. He reports he used "pain pills" previously, but not recently. Informed patient if he had another positive UDS besides what is prescribed, this provider would no longer being able to refill and manage his Clonazepam prescription. He will need to be referred to psych.

## 2023-01-22 ENCOUNTER — Telehealth: Payer: Self-pay

## 2023-01-22 NOTE — Telephone Encounter (Signed)
   Telephone encounter was:  Successful.  01/22/2023 Name: Jafar Clowser. MRN: [id number] DOB: Jan 19, 1951  Wilfredo Sana. is a 72 y.o. year old male who is a primary care patient of Alveria Apley, NP . The community resource team was consulted for assistance with Transportation Needs   Care guide performed the following interventions: Spoke with patient about insurance transportation benefit and will also mail SCAT application.  Follow Up Plan:  No further follow up planned at this time. The patient has been provided with needed resources.  Jaira Canady [facility]  [facility] Population Health Community Resource Care Guide   [email or web address]  ?? [phone]   Website: [email or web address]

## 2023-01-22 NOTE — Telephone Encounter (Signed)
Called again - no answer and no VM

## 2023-01-23 ENCOUNTER — Other Ambulatory Visit: Payer: Self-pay

## 2023-01-23 DIAGNOSIS — I10 Essential (primary) hypertension: Secondary | ICD-10-CM

## 2023-01-23 LAB — DRUG SCREEN 764883 11+OXYCO+ALC+CRT-BUND
Amphetamines, Urine: NEGATIVE ng/mL
Barbiturate: NEGATIVE ng/mL
Cannabinoid Quant, Ur: NEGATIVE ng/mL
Cocaine (Metabolite): NEGATIVE ng/mL
Creatinine: 129.4 mg/dL (ref 20.0–300.0)
Ethanol: NEGATIVE %
Meperidine: NEGATIVE ng/mL
Methadone Screen, Urine: NEGATIVE ng/mL
Oxycodone/Oxymorphone, Urine: NEGATIVE ng/mL
Phencyclidine: NEGATIVE ng/mL
Propoxyphene: NEGATIVE ng/mL
Tramadol: NEGATIVE ng/mL
pH, Urine: 5 (ref 4.5–8.9)

## 2023-01-23 LAB — OPIATES CONFIRMATION, URINE
Codeine: NEGATIVE
Hydrocodone: NEGATIVE
Hydromorphone: NEGATIVE
Morphine Confirm: 2215 ng/mL
Morphine: POSITIVE — AB
Opiates: POSITIVE ng/mL — AB

## 2023-01-23 LAB — BENZODIAZEPINES CONFIRM, URINE: Benzodiazepines: NEGATIVE ng/mL

## 2023-01-23 MED ORDER — LOSARTAN POTASSIUM 100 MG PO TABS
100.0000 mg | ORAL_TABLET | Freq: Every day | ORAL | 1 refills | Status: DC
Start: 2023-01-23 — End: 2023-08-21

## 2023-01-23 NOTE — Telephone Encounter (Signed)
Vm not set up  Letter out

## 2023-01-24 ENCOUNTER — Telehealth: Payer: Self-pay

## 2023-01-24 NOTE — Telephone Encounter (Signed)
-----   Message from Alveria Apley, NP sent at 01/24/2023  8:36 AM EDT ----- Your urine drug screen has come back with confirmation that you are positive for Morphine. If you have another abnormal urine drug screen, this provider will no longer be able to prescribed your anxiety medication. You will be referred out to psych. It is very important to take only your medication that is prescribed by your providers.

## 2023-02-01 ENCOUNTER — Encounter (HOSPITAL_BASED_OUTPATIENT_CLINIC_OR_DEPARTMENT_OTHER): Payer: Medicare HMO

## 2023-02-01 ENCOUNTER — Ambulatory Visit: Payer: Medicare HMO | Admitting: Nurse Practitioner

## 2023-02-19 ENCOUNTER — Other Ambulatory Visit: Payer: Self-pay | Admitting: Family Medicine

## 2023-02-19 DIAGNOSIS — F419 Anxiety disorder, unspecified: Secondary | ICD-10-CM

## 2023-02-20 NOTE — Telephone Encounter (Signed)
Patient is requesting a refill of the following medications: Requested Prescriptions   Pending Prescriptions Disp Refills   clonazePAM (KLONOPIN) 1 MG tablet [Pharmacy Med Name: CLONAZEPAM 1 MG TABLET] 30 tablet 0    Sig: TAKE 1/2 TO 1 TABLET (0.5-1 MG TOTAL) BY MOUTH DAILY.    Date of patient request: 02/20/23 Last office visit: 01/19/23 Date of last refill: 01/22/23 Last refill amount: 30 Follow up time period per chart: 3 months

## 2023-02-20 NOTE — Telephone Encounter (Signed)
Per conversation with Mardene Celeste, Pt failed recent drug screening and did not show for his follow up appointment so this medication is not eligible for a refill

## 2023-02-28 IMAGING — CT CT ABD-PELV W/ CM
2 of 5 series · 15 of 46 positions shown, 17 images · IV contrast (OMNIPAQUE 300)
Comparison: None Available.

CLINICAL DATA: An intentional weight loss.

EXAM:
CT ABDOMEN AND PELVIS WITH CONTRAST
TECHNIQUE: Multidetector CT imaging of the abdomen and pelvis was performed
using the standard protocol following bolus administration of
intravenous contrast.

[Series 2: abd/pel w · axial · 0.75mm/px · z∈[-499,-89]mm · 12 of 92 slices shown, 14 images]
[im 5/92  soft-tissue]
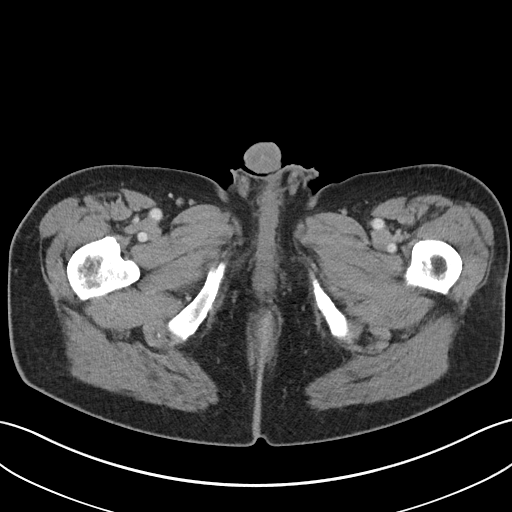
[im 5/92  bone]
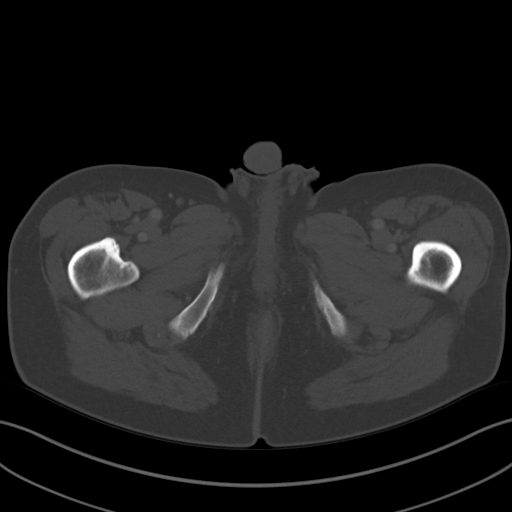
[im 15/92  soft-tissue]
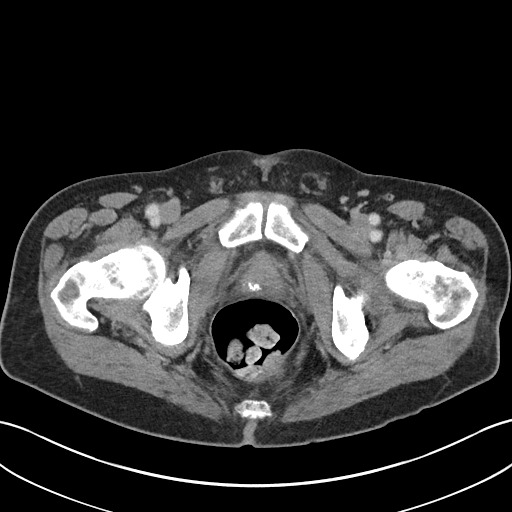
[im 20/92  soft-tissue]
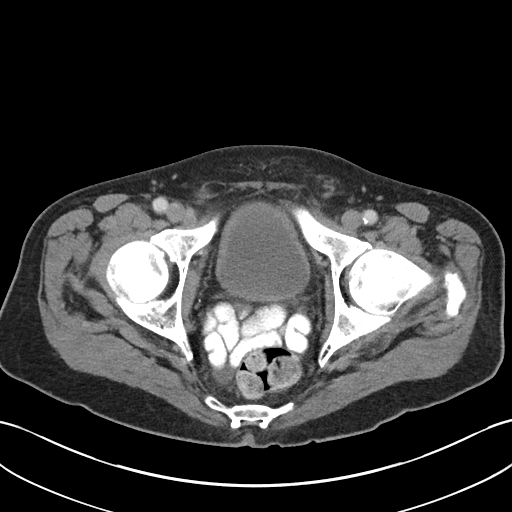
[im 29/92  soft-tissue]
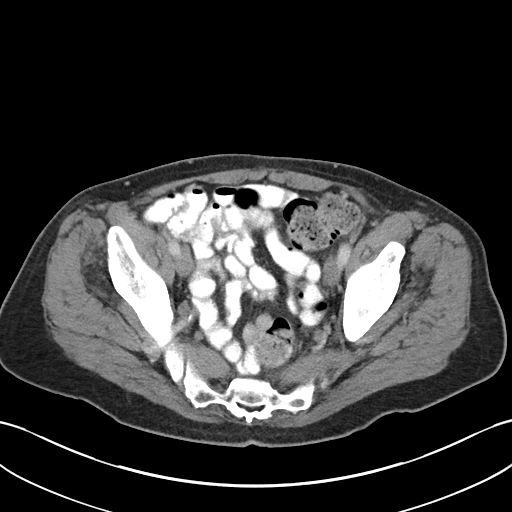
[im 34/92  soft-tissue]
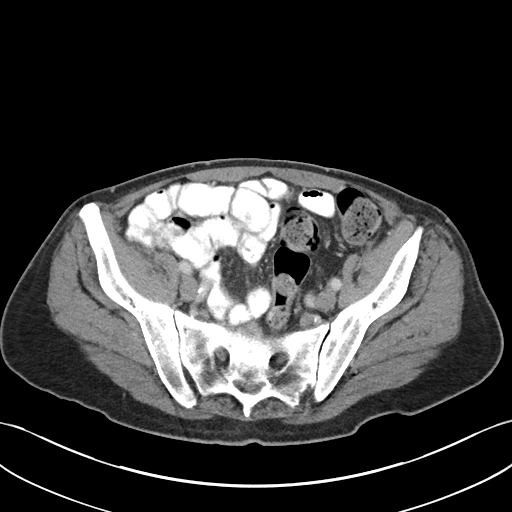
[im 44/92  soft-tissue]
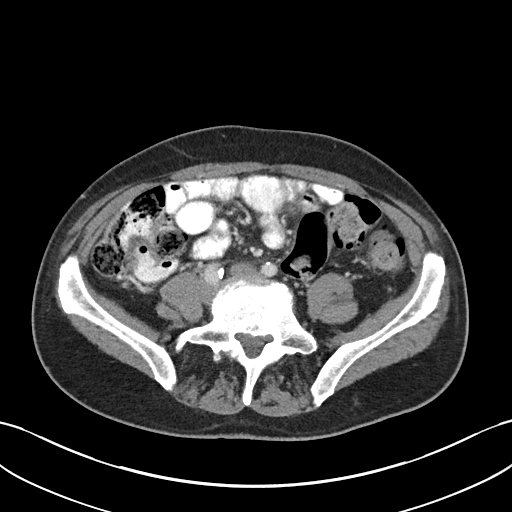
[im 48/92  soft-tissue]
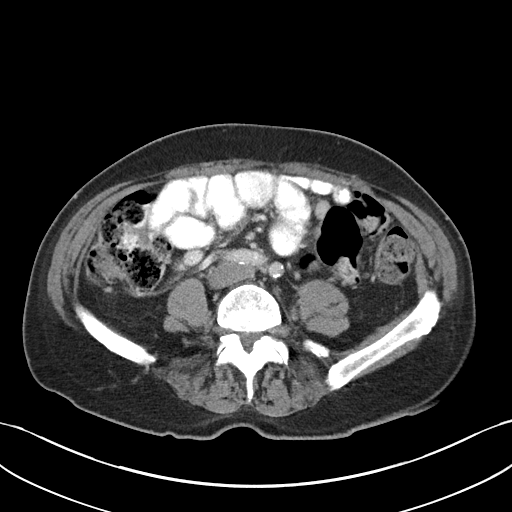
[im 58/92  soft-tissue]
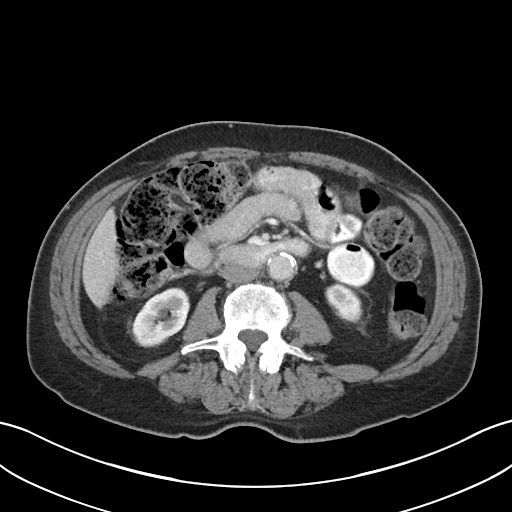
[im 63/92  soft-tissue]
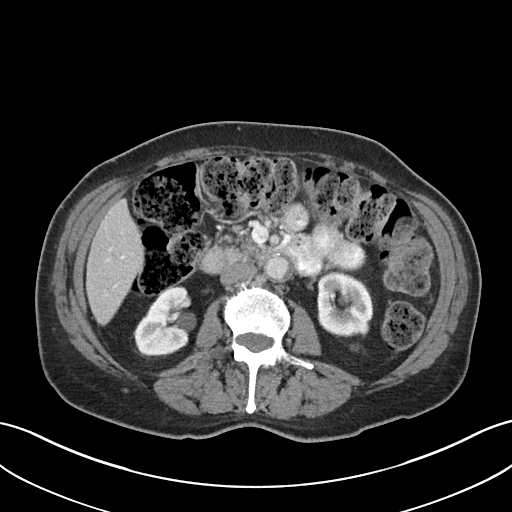
[im 63/92  bone]
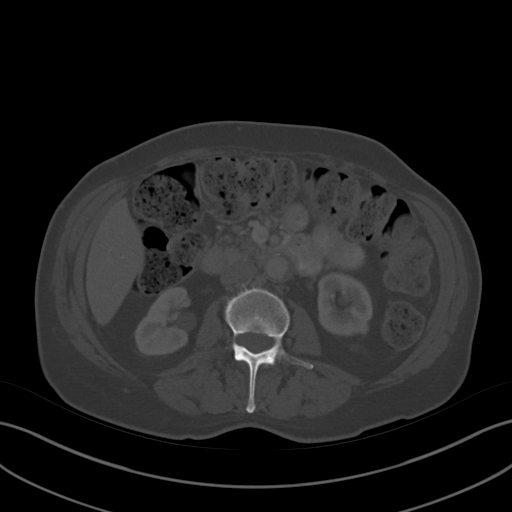
[im 72/92  soft-tissue]
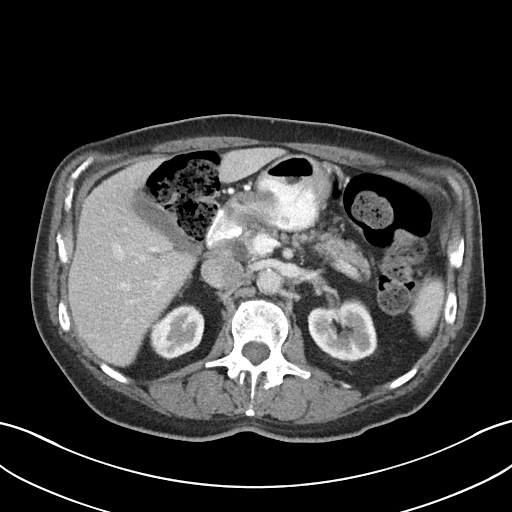
[im 77/92  soft-tissue]
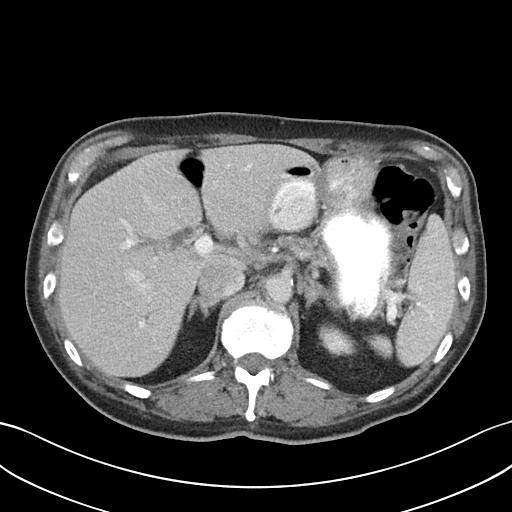
[im 87/92  soft-tissue]
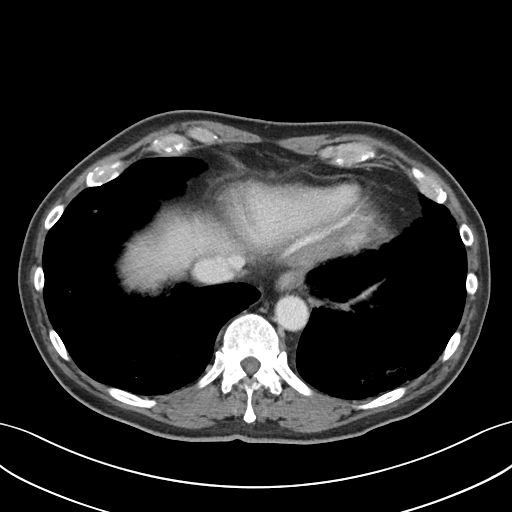

[Series 5: coronal st · coronal · 0.71mm/px · 3 of 78 slices shown]
[im 26/78  soft-tissue]
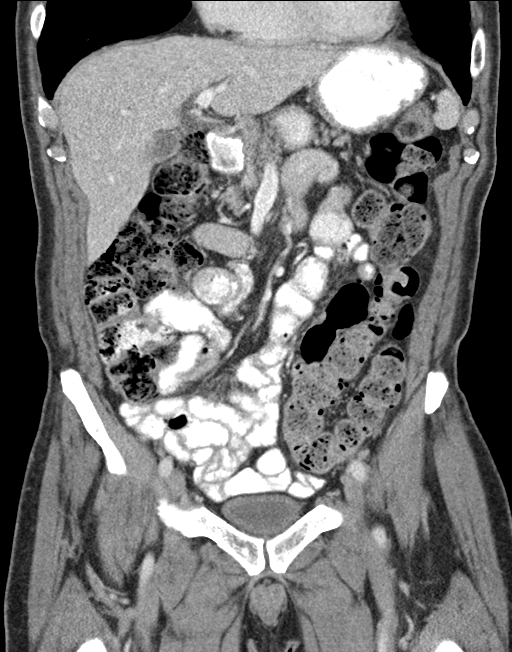
[im 35/78  soft-tissue]
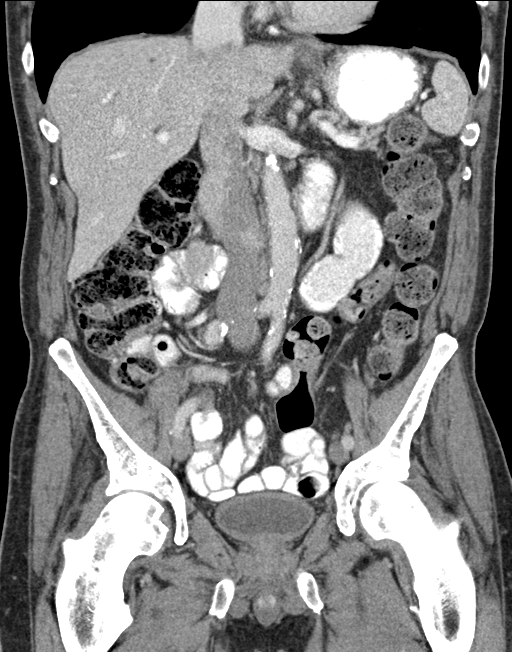
[im 43/78  soft-tissue]
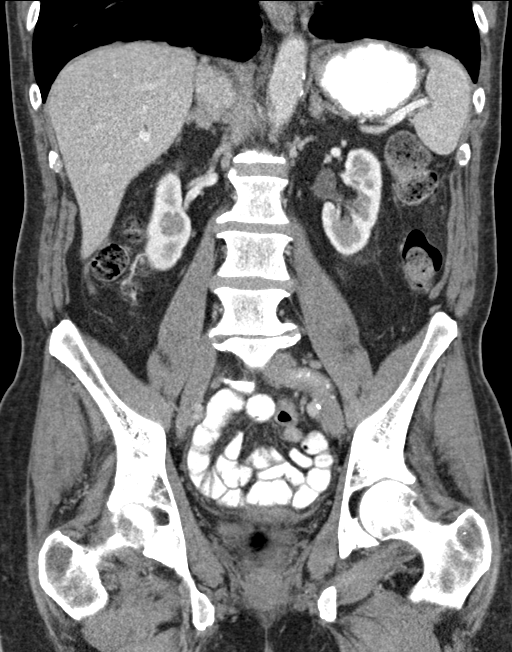

[15 of 46 positions shown; findings below may reference images not displayed]

RADIATION DOSE REDUCTION: This exam was performed according to the
departmental dose-optimization program which includes automated
exposure control, adjustment of the mA and/or kV according to
patient size and/or use of iterative reconstruction technique.

CONTRAST:  100mL OMNIPAQUE IOHEXOL 300 MG/ML  SOLN
FINDINGS: Lower chest: There are bibasilar dependent atelectasis. There is
diffuse chronic interstitial coarsening. Right lung base nodules
measure up to 6 mm.

No intra-abdominal free air or free fluid.

Hepatobiliary: The liver is unremarkable. There is mild intrahepatic
biliary ductal dilatation. The gallbladder is unremarkable. Mildly
dilated common bile duct measuring approximately 1 cm. There is
tapering at the head of the pancreas. No calcified stone noted in
the central CBD.

Pancreas: Unremarkable. No pancreatic ductal dilatation or
surrounding inflammatory changes.

Spleen: Normal in size without focal abnormality.

Adrenals/Urinary Tract: The adrenal glands are unremarkable. Mild
bilateral renal parenchyma atrophy. There is no hydronephrosis on
either side. There is symmetric enhancement and excretion of
contrast by both kidneys. The visualized ureters and urinary bladder
appear unremarkable.

Stomach/Bowel: There is moderate stool throughout the colon. There
is no bowel obstruction or active inflammation. The appendix is
normal.

Vascular/Lymphatic: Moderate aortoiliac atherosclerotic disease. The
IVC is unremarkable. No portal venous gas. There is no adenopathy.

Reproductive: The prostate and seminal vesicles are grossly
unremarkable. No pelvic mass.

Other: Small fat containing umbilical hernia.

Musculoskeletal: Degenerative changes of the spine. Partially
visualized T10 hemangioma. No acute osseous pathology.
IMPRESSION: 1. No acute intra-abdominal or pelvic pathology.
2. Moderate colonic stool burden. No bowel obstruction. Normal
appendix.
3. Aortic Atherosclerosis (D8075-CG5.5).

## 2023-03-15 ENCOUNTER — Other Ambulatory Visit: Payer: Self-pay | Admitting: Family Medicine

## 2023-03-15 ENCOUNTER — Other Ambulatory Visit: Payer: Self-pay

## 2023-03-15 DIAGNOSIS — I10 Essential (primary) hypertension: Secondary | ICD-10-CM

## 2023-03-15 MED ORDER — HYDROCHLOROTHIAZIDE 12.5 MG PO CAPS
12.5000 mg | ORAL_CAPSULE | Freq: Every day | ORAL | 0 refills | Status: DC
Start: 2023-03-15 — End: 2024-03-03

## 2023-03-15 NOTE — Telephone Encounter (Signed)
Last refill 12/12/2022 Last Visit: 01/19/2023 No upcoming appts.

## 2023-04-19 ENCOUNTER — Encounter: Payer: Self-pay | Admitting: Family Medicine

## 2023-05-01 ENCOUNTER — Other Ambulatory Visit: Payer: Self-pay

## 2023-05-01 ENCOUNTER — Telehealth: Payer: Self-pay

## 2023-05-01 NOTE — Telephone Encounter (Signed)
Letter has been sent

## 2023-08-18 ENCOUNTER — Other Ambulatory Visit: Payer: Self-pay | Admitting: Family Medicine

## 2023-08-18 DIAGNOSIS — I10 Essential (primary) hypertension: Secondary | ICD-10-CM

## 2023-10-12 ENCOUNTER — Encounter: Payer: Self-pay | Admitting: *Deleted

## 2023-11-12 ENCOUNTER — Encounter: Payer: Self-pay | Admitting: Emergency Medicine

## 2023-11-27 ENCOUNTER — Other Ambulatory Visit: Payer: Self-pay

## 2023-11-27 DIAGNOSIS — I10 Essential (primary) hypertension: Secondary | ICD-10-CM

## 2023-11-27 MED ORDER — HYDROCHLOROTHIAZIDE 12.5 MG PO CAPS
12.5000 mg | ORAL_CAPSULE | Freq: Every day | ORAL | 0 refills | Status: DC
Start: 2023-11-27 — End: 2024-01-09

## 2023-12-20 ENCOUNTER — Other Ambulatory Visit: Payer: Self-pay | Admitting: Family Medicine

## 2023-12-20 DIAGNOSIS — R053 Chronic cough: Secondary | ICD-10-CM

## 2024-01-09 ENCOUNTER — Ambulatory Visit
Admission: EM | Admit: 2024-01-09 | Discharge: 2024-01-09 | Disposition: A | Discharge status: Home / Self Care | Attending: Family Medicine | Admitting: Family Medicine

## 2024-01-09 ENCOUNTER — Encounter: Payer: Self-pay | Admitting: Emergency Medicine

## 2024-01-09 ENCOUNTER — Ambulatory Visit (INDEPENDENT_AMBULATORY_CARE_PROVIDER_SITE_OTHER)

## 2024-01-09 DIAGNOSIS — J441 Chronic obstructive pulmonary disease with (acute) exacerbation: Secondary | ICD-10-CM | POA: Diagnosis not present

## 2024-01-09 DIAGNOSIS — J449 Chronic obstructive pulmonary disease, unspecified: Secondary | ICD-10-CM | POA: Diagnosis not present

## 2024-01-09 DIAGNOSIS — R053 Chronic cough: Secondary | ICD-10-CM | POA: Diagnosis not present

## 2024-01-09 MED ORDER — AZITHROMYCIN 250 MG PO TABS
250.0000 mg | ORAL_TABLET | Freq: Every day | ORAL | 0 refills | Status: AC
Start: 2024-01-09 — End: ?

## 2024-01-09 MED ORDER — PREDNISONE 20 MG PO TABS
40.0000 mg | ORAL_TABLET | Freq: Every day | ORAL | 0 refills | Status: AC
Start: 2024-01-09 — End: ?

## 2024-01-09 MED ORDER — HYDROCODONE BIT-HOMATROP MBR 5-1.5 MG/5ML PO SOLN: 2.5000 mL | Freq: Every evening | ORAL | 0 refills | Status: AC | PRN

## 2024-01-09 NOTE — ED Triage Notes (Signed)
 Pt reports productive cough, nasal congestion, and fatigue x3 weeks. Notes yellow sputum. Only taking ibuprofen  for symptoms. Denies fevers, sore throat, or chills.   Pt also notes that about 1 month ago he fell out of kitchen chair while asleep and hit his head. Has not seen a medical provider since this fall. Per pt, called EMS out to house and they recommended urgent care.

## 2024-01-09 NOTE — Discharge Instructions (Signed)
 Be aware, your cough medication may cause drowsiness. Please do not drive, operate heavy machinery or make important decisions while on this medication, it can cloud your judgement.

## 2024-01-09 NOTE — ED Provider Notes (Signed)
 [facility]   [id number] 01/09/24 Arrival Time: 0936  ASSESSMENT & PLAN:  1. Persistent cough for 3 weeks or longer   2. COPD exacerbation (HCC)    I have personally viewed and independently interpreted the imaging studies ordered this visit. CXR: I do not appreciate and acute infiltrates. Radiology report indicates new right apical airspace dz.  Given symptoms and duration of symptoms, begin: Meds ordered this encounter  Medications   HYDROcodone  bit-homatropine (HYCODAN) 5-1.5 MG/5ML syrup    Sig: Take 2.5-5 mLs by mouth at bedtime as needed for cough.    Dispense:  60 mL    Refill:  0   predniSONE  (DELTASONE ) 20 MG tablet    Sig: Take 2 tablets (40 mg total) by mouth daily.    Dispense:  10 tablet    Refill:  0   azithromycin  (ZITHROMAX ) 250 MG tablet    Sig: Take 1 tablet (250 mg total) by mouth daily. Take first 2 tablets together, then 1 every day until finished.    Dispense:  6 tablet    Refill:  0     Follow-up Information     Schedule an appointment as soon as possible for a visit  with Francenia Ingle, NP.   Specialty: Family Medicine Why: For follow up. Contact information: [id number] [place] [address] [phone]                 Reviewed expectations re: course of current medical issues. Questions answered. Outlined signs and symptoms indicating need for more acute intervention. Understanding verbalized. After Visit Summary given.   SUBJECTIVE: History from: Patient. Jerry Spears. is a 73 y.o. male. Reports prod cough; x 3 weeks; is fatigued; with mild wheezing at times; h/o smoking. Denies fever. Normal PO intake without n/v/d. Cough is affecting sleep.  OBJECTIVE:  Vitals:   01/09/24 1004  BP: 138/80  Pulse: 74  Resp: 20  Temp: 98.2 F (36.8 C)  TempSrc: Oral  SpO2: 95%    General appearance: alert; no distress Eyes: PERRLA; EOMI; conjunctiva normal HENT: [place]; AT; with mild nasal  congestion Neck: supple  Lungs: speaks full sentences without difficulty; unlabored; mild to moderate bilateral exp wheezing; active coughing Extremities: no edema Skin: warm and dry Neurologic: normal gait Psychological: alert and cooperative; normal mood and affect   Imaging: DG Chest 2 View Result Date: 01/09/2024 CLINICAL DATA:  persistent cough x 3 weeks EXAM: CHEST - 2 VIEW COMPARISON:  07/06/2020 FINDINGS: Pulmonary hyperinflation with coarse attenuated bronchovascular markings particularly in the upper lobes. New subpleural airspace opacities at the right lung apex. Stable left infrahilar and basilar linear opacities . Heart size and mediastinal contours are within normal limits. Aortic Atherosclerosis (ICD10-170.0). No effusion. Visualized bones unremarkable. IMPRESSION: 1. New right apical airspace disease. 2. COPD. Electronically Signed   By: Nicoletta Barrier M.D.   On: 01/09/2024 11:20    No Known Allergies  Past Medical History:  Diagnosis Date   Anxiety    Callus    Chronic back pain    Constipation    Corn of foot    DDD (degenerative disc disease), lumbar    Depression    Hypertension    Lung disease    Social History   Socioeconomic History   Marital status: Single    Spouse name: Not on file   Number of children: 2   Years of education: Not on file   Highest education level: High school graduate  Occupational History   Not on file  Tobacco Use   Smoking status: Every Day    Current packs/day: 1.00    Average packs/day: 1 pack/day for 56.0 years (56.0 ttl pk-yrs)    Types: Cigarettes   Smokeless tobacco: Never  Vaping Use   Vaping status: Never Used  Substance and Sexual Activity   Alcohol use: Not Currently   Drug use: No   Sexual activity: Not Currently    Birth control/protection: None  Other Topics Concern   Not on file  Social History Narrative   Patient has a home, he rents one of his rooms for friend and his girlfriend. 1 dog.    Social Drivers  of Corporate investment banker Strain: Not on file  Food Insecurity: Food Insecurity Present (11/21/2022)   Hunger Vital Sign    Worried About Running Out of Food in the Last Year: Sometimes true    Ran Out of Food in the Last Year: Sometimes true  Transportation Needs: No Transportation Needs (01/22/2023)   PRAPARE - Administrator, Civil Service (Medical): No    Lack of Transportation (Non-Medical): No  Physical Activity: Not on file  Stress: Stress Concern Present (01/19/2023)   [employer] of Occupational Health - Occupational Stress Questionnaire    Feeling of Stress : Rather much  Social Connections: Socially Isolated (11/21/2022)   Social Connection and Isolation Panel [NHANES]    Frequency of Communication with Friends and Family: Twice a week    Frequency of Social Gatherings with Friends and Family: Once a week    Attends Religious Services: Never    Database administrator or Organizations: No    Attends Banker Meetings: Never    Marital Status: Divorced  Catering manager Violence: Not At Risk (11/21/2022)   Humiliation, Afraid, Rape, and Kick questionnaire    Fear of Current or Ex-Partner: No    Emotionally Abused: No    Physically Abused: No    Sexually Abused: No   Family History  Problem Relation Age of Onset   Aneurysm Mother    Cancer Sister        Bone   Past Surgical History:  Procedure Laterality Date   FOOT SURGERY Left      Afton Albright, MD 01/09/24 1143

## 2024-03-03 ENCOUNTER — Other Ambulatory Visit: Payer: Self-pay | Admitting: Family Medicine

## 2024-03-03 DIAGNOSIS — I10 Essential (primary) hypertension: Secondary | ICD-10-CM

## 2024-04-13 ENCOUNTER — Ambulatory Visit: Payer: Self-pay | Admitting: Internal Medicine

## 2024-04-13 NOTE — Progress Notes (Signed)
 Thess results are not clniically significant

## 2024-05-31 ENCOUNTER — Other Ambulatory Visit: Payer: Self-pay | Admitting: Family

## 2024-05-31 DIAGNOSIS — I10 Essential (primary) hypertension: Secondary | ICD-10-CM

## 2024-07-01 ENCOUNTER — Other Ambulatory Visit: Payer: Self-pay | Admitting: Family

## 2024-07-01 DIAGNOSIS — I10 Essential (primary) hypertension: Secondary | ICD-10-CM

## 2024-08-06 ENCOUNTER — Other Ambulatory Visit: Payer: Self-pay

## 2024-08-06 DIAGNOSIS — Z0000 Encounter for general adult medical examination without abnormal findings: Secondary | ICD-10-CM

## 2024-08-12 ENCOUNTER — Other Ambulatory Visit
